# Patient Record
Sex: Female | Born: 1935 | Race: Black or African American | Hispanic: No | Marital: Married | Smoking: Former smoker
Health system: Southern US, Community
[De-identification: ages and names within clinical notes are randomized; demographics above are authoritative.]

## PROBLEM LIST (undated history)

## (undated) DIAGNOSIS — F419 Anxiety disorder, unspecified: Secondary | ICD-10-CM

## (undated) DIAGNOSIS — H269 Unspecified cataract: Secondary | ICD-10-CM

## (undated) DIAGNOSIS — M1990 Unspecified osteoarthritis, unspecified site: Secondary | ICD-10-CM

## (undated) DIAGNOSIS — D649 Anemia, unspecified: Secondary | ICD-10-CM

## (undated) DIAGNOSIS — I2510 Atherosclerotic heart disease of native coronary artery without angina pectoris: Secondary | ICD-10-CM

## (undated) DIAGNOSIS — C801 Malignant (primary) neoplasm, unspecified: Secondary | ICD-10-CM

## (undated) DIAGNOSIS — E039 Hypothyroidism, unspecified: Secondary | ICD-10-CM

## (undated) DIAGNOSIS — I10 Essential (primary) hypertension: Secondary | ICD-10-CM

## (undated) DIAGNOSIS — F0390 Unspecified dementia without behavioral disturbance: Secondary | ICD-10-CM

## (undated) DIAGNOSIS — E119 Type 2 diabetes mellitus without complications: Secondary | ICD-10-CM

## (undated) DIAGNOSIS — E7800 Pure hypercholesterolemia, unspecified: Secondary | ICD-10-CM

## (undated) DIAGNOSIS — Z789 Other specified health status: Secondary | ICD-10-CM

---

## 1998-09-07 ENCOUNTER — Other Ambulatory Visit: Admission: RE | Admit: 1998-09-07 | Discharge: 1998-09-07 | Payer: Self-pay | Admitting: Radiology

## 2003-04-20 ENCOUNTER — Encounter: Payer: Self-pay | Admitting: Emergency Medicine

## 2003-04-20 ENCOUNTER — Emergency Department (HOSPITAL_COMMUNITY): Admission: EM | Admit: 2003-04-20 | Discharge: 2003-04-20 | Payer: Self-pay | Admitting: Emergency Medicine

## 2003-10-19 ENCOUNTER — Other Ambulatory Visit: Admission: RE | Admit: 2003-10-19 | Discharge: 2003-10-19 | Payer: Self-pay | Admitting: Obstetrics and Gynecology

## 2004-01-12 ENCOUNTER — Ambulatory Visit (HOSPITAL_COMMUNITY): Admission: RE | Admit: 2004-01-12 | Discharge: 2004-01-12 | Payer: Self-pay | Admitting: General Surgery

## 2004-12-16 HISTORY — PX: MASTECTOMY: SHX3

## 2005-01-17 ENCOUNTER — Ambulatory Visit (HOSPITAL_COMMUNITY): Admission: RE | Admit: 2005-01-17 | Discharge: 2005-01-17 | Payer: Self-pay | Admitting: General Surgery

## 2005-01-21 ENCOUNTER — Ambulatory Visit (HOSPITAL_COMMUNITY): Admission: RE | Admit: 2005-01-21 | Discharge: 2005-01-21 | Payer: Self-pay | Admitting: General Surgery

## 2005-01-24 ENCOUNTER — Encounter (HOSPITAL_COMMUNITY): Admission: RE | Admit: 2005-01-24 | Discharge: 2005-01-25 | Payer: Self-pay | Admitting: Internal Medicine

## 2005-02-07 ENCOUNTER — Inpatient Hospital Stay (HOSPITAL_COMMUNITY): Admission: RE | Admit: 2005-02-07 | Discharge: 2005-02-17 | Payer: Self-pay | Admitting: General Surgery

## 2005-04-15 ENCOUNTER — Inpatient Hospital Stay (HOSPITAL_COMMUNITY): Admission: EM | Admit: 2005-04-15 | Discharge: 2005-04-21 | Payer: Self-pay | Admitting: Emergency Medicine

## 2006-02-26 ENCOUNTER — Ambulatory Visit (HOSPITAL_COMMUNITY): Admission: RE | Admit: 2006-02-26 | Discharge: 2006-02-26 | Payer: Self-pay | Admitting: Family Medicine

## 2006-03-05 ENCOUNTER — Ambulatory Visit (HOSPITAL_COMMUNITY): Admission: RE | Admit: 2006-03-05 | Discharge: 2006-03-05 | Payer: Self-pay | Admitting: Family Medicine

## 2006-03-05 ENCOUNTER — Encounter (INDEPENDENT_AMBULATORY_CARE_PROVIDER_SITE_OTHER): Payer: Self-pay | Admitting: Specialist

## 2006-03-05 ENCOUNTER — Encounter (INDEPENDENT_AMBULATORY_CARE_PROVIDER_SITE_OTHER): Payer: Self-pay | Admitting: *Deleted

## 2006-03-05 ENCOUNTER — Encounter (INDEPENDENT_AMBULATORY_CARE_PROVIDER_SITE_OTHER): Payer: Self-pay | Admitting: Diagnostic Radiology

## 2006-03-21 ENCOUNTER — Inpatient Hospital Stay (HOSPITAL_COMMUNITY): Admission: RE | Admit: 2006-03-21 | Discharge: 2006-03-24 | Payer: Self-pay | Admitting: General Surgery

## 2006-03-21 ENCOUNTER — Encounter (INDEPENDENT_AMBULATORY_CARE_PROVIDER_SITE_OTHER): Payer: Self-pay | Admitting: Specialist

## 2006-05-12 ENCOUNTER — Ambulatory Visit (HOSPITAL_COMMUNITY): Payer: Self-pay | Admitting: Oncology

## 2006-05-12 ENCOUNTER — Encounter (HOSPITAL_COMMUNITY): Admission: RE | Admit: 2006-05-12 | Discharge: 2006-06-11 | Payer: Self-pay | Admitting: Oncology

## 2006-08-29 ENCOUNTER — Encounter (HOSPITAL_COMMUNITY): Admission: RE | Admit: 2006-08-29 | Discharge: 2006-09-12 | Payer: Self-pay | Admitting: Oncology

## 2006-08-29 ENCOUNTER — Encounter: Admission: RE | Admit: 2006-08-29 | Discharge: 2006-09-12 | Payer: Self-pay | Admitting: Oncology

## 2006-08-29 ENCOUNTER — Ambulatory Visit (HOSPITAL_COMMUNITY): Payer: Self-pay | Admitting: Oncology

## 2007-02-27 ENCOUNTER — Ambulatory Visit (HOSPITAL_COMMUNITY): Payer: Self-pay | Admitting: Oncology

## 2007-02-27 ENCOUNTER — Encounter (HOSPITAL_COMMUNITY): Admission: RE | Admit: 2007-02-27 | Discharge: 2007-03-29 | Payer: Self-pay | Admitting: Oncology

## 2007-08-19 ENCOUNTER — Ambulatory Visit (HOSPITAL_COMMUNITY): Payer: Self-pay | Admitting: Oncology

## 2009-01-27 ENCOUNTER — Inpatient Hospital Stay (HOSPITAL_COMMUNITY): Admission: EM | Admit: 2009-01-27 | Discharge: 2009-02-03 | Payer: Self-pay | Admitting: Emergency Medicine

## 2009-01-31 ENCOUNTER — Encounter: Payer: Self-pay | Admitting: Neurology

## 2009-10-09 ENCOUNTER — Ambulatory Visit (HOSPITAL_COMMUNITY): Admission: RE | Admit: 2009-10-09 | Discharge: 2009-10-09 | Payer: Self-pay | Admitting: Internal Medicine

## 2009-10-12 ENCOUNTER — Ambulatory Visit (HOSPITAL_COMMUNITY): Admission: RE | Admit: 2009-10-12 | Discharge: 2009-10-12 | Payer: Self-pay | Admitting: Internal Medicine

## 2009-10-19 ENCOUNTER — Ambulatory Visit (HOSPITAL_COMMUNITY): Admission: RE | Admit: 2009-10-19 | Discharge: 2009-10-19 | Payer: Self-pay | Admitting: Internal Medicine

## 2011-01-06 ENCOUNTER — Encounter: Payer: Self-pay | Admitting: Family Medicine

## 2011-04-02 LAB — DIFFERENTIAL
Basophils Relative: 1 % (ref 0–1)
Eosinophils Absolute: 0.1 10*3/uL (ref 0.0–0.7)
Eosinophils Relative: 1 % (ref 0–5)
Eosinophils Relative: 2 % (ref 0–5)
Lymphs Abs: 1.3 10*3/uL (ref 0.7–4.0)
Lymphs Abs: 1.2 10*3/uL (ref 0.7–4.0)
Monocytes Absolute: 0.4 10*3/uL (ref 0.1–1.0)
Neutro Abs: 3.2 10*3/uL (ref 1.7–7.7)
Neutrophils Relative %: 66 % (ref 43–77)

## 2011-04-02 LAB — CBC
HCT: 34.1 % — ABNORMAL LOW (ref 36.0–46.0)
HCT: 36.3 % (ref 36.0–46.0)
Hemoglobin: 11.4 g/dL — ABNORMAL LOW (ref 12.0–15.0)
MCHC: 33.7 g/dL (ref 30.0–36.0)
MCV: 83.1 fL (ref 78.0–100.0)
MCV: 82.9 fL (ref 78.0–100.0)
Platelets: 185 10*3/uL (ref 150–400)
Platelets: 200 10*3/uL (ref 150–400)
RDW: 15.9 % — ABNORMAL HIGH (ref 11.5–15.5)
RDW: 16.1 % — ABNORMAL HIGH (ref 11.5–15.5)

## 2011-04-02 LAB — BASIC METABOLIC PANEL
BUN: 14 mg/dL (ref 6–23)
BUN: 14 mg/dL (ref 6–23)
BUN: 22 mg/dL (ref 6–23)
CO2: 27 mEq/L (ref 19–32)
CO2: 27 mEq/L (ref 19–32)
Chloride: 105 mEq/L (ref 96–112)
Chloride: 104 mEq/L (ref 96–112)
Chloride: 107 mEq/L (ref 96–112)
Creatinine, Ser: 1 mg/dL (ref 0.4–1.2)
Creatinine, Ser: 1.08 mg/dL (ref 0.4–1.2)
Glucose, Bld: 130 mg/dL — ABNORMAL HIGH (ref 70–99)
Glucose, Bld: 124 mg/dL — ABNORMAL HIGH (ref 70–99)
Potassium: 3.6 mEq/L (ref 3.5–5.1)
Potassium: 3.4 mEq/L — ABNORMAL LOW (ref 3.5–5.1)

## 2011-04-02 LAB — GLUCOSE, CAPILLARY
Glucose-Capillary: 103 mg/dL — ABNORMAL HIGH (ref 70–99)
Glucose-Capillary: 104 mg/dL — ABNORMAL HIGH (ref 70–99)
Glucose-Capillary: 109 mg/dL — ABNORMAL HIGH (ref 70–99)
Glucose-Capillary: 112 mg/dL — ABNORMAL HIGH (ref 70–99)
Glucose-Capillary: 135 mg/dL — ABNORMAL HIGH (ref 70–99)
Glucose-Capillary: 171 mg/dL — ABNORMAL HIGH (ref 70–99)
Glucose-Capillary: 176 mg/dL — ABNORMAL HIGH (ref 70–99)
Glucose-Capillary: 97 mg/dL (ref 70–99)
Glucose-Capillary: 105 mg/dL — ABNORMAL HIGH (ref 70–99)
Glucose-Capillary: 126 mg/dL — ABNORMAL HIGH (ref 70–99)
Glucose-Capillary: 143 mg/dL — ABNORMAL HIGH (ref 70–99)

## 2011-04-02 LAB — POCT I-STAT, CHEM 8
Chloride: 103 mEq/L (ref 96–112)
HCT: 36 % (ref 36.0–46.0)
Potassium: 3.8 mEq/L (ref 3.5–5.1)
Sodium: 141 mEq/L (ref 135–145)

## 2011-04-02 LAB — PROTIME-INR
INR: 1.1 (ref 0.00–1.49)
Prothrombin Time: 14 seconds (ref 11.6–15.2)

## 2011-04-30 NOTE — Consult Note (Signed)
NAME:  April Myers, April Myers                ACCOUNT NO.:  [id number]   MEDICAL RECORD NO.:  [id number]          PATIENT TYPE:  INP   LOCATION:  A320                          FACILITY:  [facility]   PHYSICIAN:  Kofi A. Gerilyn Pilgrim, M.D. DATE OF BIRTH:  04/09/1921   DATE OF CONSULTATION:  DATE OF DISCHARGE:                                 CONSULTATION   REASON FOR CONSULTATION:  Dizziness.   The patient is an 77 year old black female who developed the acute onset  of dizziness described as the room moving around.  She has had previous  bouts of dizzy spells, but reports that this one was distinctly  different and particularly more intense.  The spells being quite severe  lasted for about 30 minutes or so.  She did have some nausea and  vomiting.  No abdominal discomfort was reported.  No chest pain.  No  clear diplopia, although she reports some blurred vision.  No slurred  speech.   PAST MEDICAL HISTORY:  Significant for breast cancer.   SURGICAL HISTORY:  Hysterectomy.   ALLERGIES:  None listed.   SOCIAL HISTORY:  The patient lives alone although she is apparently had  support of a nephew who is present with her during this event.  No  alcohol use.  No illicit drug use.  No tobacco use.   PRESENT MEDICATION:  None luted.   PHYSICAL EXAMINATION:  Shows an obese pleasant lady in no acute  distress.  HEENT EVALUATION:  Neck is supple.  Head is normocephalic, atraumatic.  ABDOMEN:  Obese, but soft.  EXTREMITIES:  No significant edema.  MENTATION:  She is awake.  She is alert.  She is lucid, coherent.  No  dysarthria or aphasia is noted.  She follow commands briskly.  CN evaluation she has left beating nystagmus with primary gaze, is also  present towards the left.  She does have full extraocular movements,  although there is some limited upgaze,  possibly related to age.  Visual  fields are intact.  Pupils are 4 mm and briskly reactive.  Facial muscle  strength is symmetric.  Tongue is midline.   Motor examination shows good  strength involving the upper extremities.  She does have at least  antigravity strength and really about 4 involving the legs.  Reflexes  are significantly diminished in the legs.  They are normal in the upper  extremities.  Sensation normal to light touch and temperature.  Coordination shows a little bit of past pointing involving the left  upper extremity and possibly a mild amount of dysmetria.  She also has  good heel-to-shin on both sides.   Head CT scan is reviewed and shows chronic findings, nothing acute is  observed.  Sodium 141, potassium is 3.8, chloride 103, BUN 16,  creatinine 1.0, glucose 149, ionized calcium 1.00 (slightly low).  Hemoglobin 12.   ASSESSMENT:  Light acute vertigo with focal nystagmus and says evidence  suggestive of the dysmetria on the left side, all pointing to the  possibility of posterior circulation infarct, likely small.  Risk  factors include age.   RECOMMENDATIONS:  I would go ahead and start the patient on antiplatelet  agents. Obtain an MRI and MRA of brain, particularly looking at the  posterior circulation to evaluate for possible vertebrobasilar occlusive  disease/insufficiency.   Thanks this consultation.      Kofi A. Gerilyn Pilgrim, M.D.  Electronically Signed     KAD/MEDQ  D:  01/28/2009  T:  01/28/2009  Job:  [id number]

## 2011-04-30 NOTE — Group Therapy Note (Signed)
NAME:  April Myers, April Myers                ACCOUNT NO.:  [id number]   MEDICAL RECORD NO.:  [id number]          PATIENT TYPE:  INP   LOCATION:  A320                          FACILITY:  [facility]   PHYSICIAN:  Kofi A. Gerilyn Pilgrim, M.D. DATE OF BIRTH:  [birth year]   DATE OF PROCEDURE:  DATE OF DISCHARGE:                                 PROGRESS NOTE   SUBJECTIVE:  The patient does not report having any dizzy spells today.   PHYSICAL EXAMINATION:  VITAL SIGNS:  Temperature 97.7, pulse 61,  respirations 20, blood pressure 140/81.  GENERAL:  The patient is awake and alert.  She is being fed her meal and  is actually is able to feed herself.  NEUROLOGICAL:  She is awake, alert and oriented, converses well.  Speech  and language are normal.  Pupils are 4 mm and reactive.  She does have  full extraocular movements.  No nystagmus noted at this time which was  an improvement.  She has good strength throughout.  MRI shows no acute  infarct on Difusion imaging.  The results were reviewed in person.  She  also had an MRA which shows no flow in the basilar artery, worrisome for  high grade basilar stenosis that appears to be flowing into the left  vertebral, but no flow in the right vertebral.   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  Findings were discussed at length with the patient.  She  appears to have some difficulty comprehending the full measure and  wanted me to contact her sister, Katheren Shams, which I did contact  at [phone].  The sister was able to comprehend immediately the  significance of the problem and consented for both the angiogram and  possible angioplasty stenting if needed.  For now, will continue with  the current antiplatelet agent.  Patient clearly needs to have an  angiogram and possibly stenting.      Kofi A. Gerilyn Pilgrim, M.D.  Electronically Signed     KAD/MEDQ  D:  01/30/2009  T:  01/30/2009  Job:  [id number]

## 2011-04-30 NOTE — H&P (Signed)
NAME:  April Myers, April Myers                ACCOUNT NO.:  [id number]   MEDICAL RECORD NO.:  [id number]          PATIENT TYPE:  INP   LOCATION:  A320                          FACILITY:  [facility]   PHYSICIAN:  Tesfaye D. Felecia Shelling, MD   DATE OF BIRTH:  June 17, 1921   DATE OF ADMISSION:  01/27/2009  DATE OF DISCHARGE:  LH                              HISTORY & PHYSICAL   CHIEF COMPLAINT:  Dizziness.   HISTORY OF PRESENT ILLNESS:  This is an 77 year old female patient with  history of multiple medical illnesses, who was brought to emergency room  due to feeling dizzy and unable to get out of bed.  The patient  complains that everything is moving around her and she is having  difficult to get out of bed.  She is unstable and unable to walk.  She  has a problem with her gait.  The patient was evaluated in the emergency  room and MRI of the head was done, which showed only a chronic left  parietal lobe infarct.  There was no acute lesion.  Her labs were within  the normal limits.  However, the patient was unable to be ambulated and  send her back home.  She lives alone by herself.  The patient was then  admitted for further treatment and supportive care.   REVIEW OF SYSTEMS:  The patient feels nauseated.  She has known  vomiting, abdominal pain, chest pain, headache, shortness of breath,  dysuria, urgency, or frequency of urination.   PAST MEDICAL HISTORY:  1. History of CA of the right breast.  2. Diabetes mellitus, diet controlled.  3. Osteoarthritis.  4. Anemia.  5. Hypertension.  6. Hypothyroidism.   CURRENT MEDICATIONS:  1. Synthroid 125 mcg daily.  2. Amlodipine 5 mg daily.  3. Allegra 180 mg daily.  4. Ambien 10 mg at bedtime.  5. Lovastatin 20 mg daily.   SOCIAL HISTORY:  The patient lives alone.  No history of alcohol,  tobacco, or substance abuse.   PHYSICAL EXAMINATION:  GENERAL:  The patient is alert, awake, and  acutely sick looking.  VITAL SIGNS:  Blood pressure 149/67, pulse 73,  respiratory rate 20, and  temperature 98.7 degrees Fahrenheit.  CHEST:  Decreased air entry, bilateral rhonchi.  CARDIOVASCULAR:  First and second heart sound heard.  No murmur.  No  gallop.  ABDOMEN:  Soft and lax.  Bowel sound is positive.  No mass or  organomegaly.  EXTREMITIES:  No leg edema.   ASSESSMENT:  1. Severe vertigo.  2. Hypothyroidism.  3. Hypertension.  4. Diabetes mellitus.  5. History of right breast carcinoma.  6. Anemia.   PLAN:  We ill admit the patient and continue on meclizine.  We will do  Neurology consult.  We will do a fall precaution, and continue her on  regular medications.      Tesfaye D. Felecia Shelling, MD  Electronically Signed     TDF/MEDQ  D:  01/28/2009  T:  01/28/2009  Job:  [id number]

## 2011-04-30 NOTE — Group Therapy Note (Signed)
NAME:  April Myers, April Myers                ACCOUNT NO.:  [id number]   MEDICAL RECORD NO.:  [id number]          PATIENT TYPE:  INP   LOCATION:  A320                          FACILITY:  [facility]   PHYSICIAN:  Kofi A. Gerilyn Pilgrim, M.D. DATE OF BIRTH:  1921-09-05   DATE OF PROCEDURE:  DATE OF DISCHARGE:  02/03/2009                                 PROGRESS NOTE   The patient reports that she is doing fairly well.  She does not have  any dizzy spells today.  She did get up and walk around with assistance.   Temperature 98.8, pulse 66, respiration 20, and blood pressure 131/75.  The patient is awake, and alert.  She converses well.  She is lucid and  coherent.  She moves all 4 extremities.  The angiography was reviewed,  impression; she has an occluded proximal vertebral.  Radiologist believe  that there is some distal reconstitution via the left posterior  communicating artery.  Although, the blood vessels looks patent.   ASSESSMENT AND PLAN:  Occluded proximal basilar artery.  Unfortunately,  the patient presented with only vertiginous symptoms and ataxia.  I  believe, this is not a case for intervention as the blood vessels has  already occluded.  I would suggest that we add Plavix, aspirin and  continue this regimen for 3 months.  Thereafter, we may want to just use  single antiplatelet agent depending on how she does.  The case was  discussed with the patient and all the relatives.      Kofi A. Gerilyn Pilgrim, M.D.  Electronically Signed     KAD/MEDQ  D:  02/02/2009  T:  02/02/2009  Job:  [id number]

## 2011-05-03 NOTE — Discharge Summary (Signed)
April Myers, April Myers                ACCOUNT NO.:  [id number]   MEDICAL RECORD NO.:  [id number]          PATIENT TYPE:  INP   LOCATION:  A202                          FACILITY:  [facility]   PHYSICIAN:  Dirk Dress. Katrinka Blazing, M.D.   DATE OF BIRTH:  07-Aug-1921   DATE OF ADMISSION:  02/07/2005  DATE OF DISCHARGE:  03/05/2006LH                                 DISCHARGE SUMMARY   DISCHARGE DIAGNOSES:  1.  Villous tumor, right colon.  2.  Multiple polyps, right colon.  3.  Chronic anemia.  4.  Hypertension.  5.  Diabetes mellitus.  6.  Severe osteoarthritis.  7.  Hypokalemia.  8.  Postoperative anemia.   SPECIAL PROCEDURE:  1.  Colonoscopy with biopsy and polypectomy.  2.  Right colectomy.   DISPOSITION:  Patient discharged home in stable and satisfactory condition.  She will continue on her baseline medications. She will have home health  nurse followup. She will be seen in the office two weeks post discharge.   SUMMARY:  This is an 77 year old female who was scheduled for colonoscopy  with biopsy and resection of a large exophytic ulcerated lesion of the  cecum. The patient underwent colonoscopy. The patient underwent colonoscopy  on January 17, 2005, and was found to have three small polyps in the  ascending colon and a large polypoid mass in the cecum. She has an extremely  poor prep and it was elected not to do resection because of the poor prep.  The patient has undergone a two day bowel prep and was scheduled for repeat  colonoscopy. Other problems include anemia, diabetes mellitus,  osteoarthritis, hypertension, and hypothyroidism. The patient was found to  have a large villous tumor of the right colon, multiple polyps of the right  colon. It was elected to proceed with right colectomy and the patient was  admitted. It was felt that right colectomy would remove the most worrisome  lesion in the cecum and the other polypoid lesions of the ascending colon.  There was some concern because  she had severe limitation of motion of her  neck, and it was felt that she might need arthroscopic assisted intubation.  The patient underwent right colectomy uneventfully.  Side-to-side  anastomosis was performed. She tolerated the procedure well. In the  postoperative period, she had postoperative anemia that required  transfusion. Her blood sugars were controlled with sliding scale insulin  coverage. The patient was extremely immobile because of her severe arthritis  and it was very difficult to get her moving. During this hospitalization, on  February 28th, the patient had a medication error and received aspirin,  Sinemet, Protonix, Protonix, 65 units of Humulin 70/30, Neurontin, and  magnesium oxide. There were not her medications. She had no adverse reaction  to these medications. By February 13, 2005, the patient was having bowel  movements.  Because of hypoventilation she had CT angio which showed no  evidence of pulmonary embolus. She had no further problems. By the 5th, it  was felt that she stable enough for  discharge. She was afebrile. O2 saturation was 94% on  room air. White count  9600, hemoglobin 11.3.  She was discharged home on a baseline medication  with home health and physical therapy followup. She was given increased  potassium orally.      LCS/MEDQ  D:  03/30/2005  T:  03/30/2005  Job:  [id number]

## 2011-05-03 NOTE — H&P (Signed)
NAMEMACKIE, GOON NO.:  [id number]   MEDICAL RECORD NO.:  [id number]          PATIENT TYPE:  INP   LOCATION:  IC06                          FACILITY:  [facility]   PHYSICIAN:  Vania Rea, M.D. DATE OF BIRTH:  1921/03/03   DATE OF ADMISSION:  04/15/2005  DATE OF DISCHARGE:  LH                                HISTORY & PHYSICAL   PRIMARY CARE PHYSICIAN:  Annia Friendly. Hill, MD/Leroy C. Katrinka Blazing, M.D.   CHIEF COMPLAINT:  Confusion and falling.   HISTORY OF PRESENT ILLNESS:  This is an 77 year old African-American lady  with a history of diabetes, diet controlled, hypertension, hypothyroidism,  osteoarthritis, who underwent right hemicolectomy for possible malignancy, 2  months ago.  Her medications included Toprol, ACEi, ARB, potassium and  Lasix.  On her admission for right hemicolectomy, about 2 months ago did  have a CT scan of the chest to rule out PE.  She was apparently in her  baseline state of health after recuperating from her surgery; but for the  past week, according to her nephew, has been acting strange. Then, she fell  in the bathroom this morning and EMS was called. EMS reportedly found her  confused with a blood pressure of 78/42. She was placed on cardiac monitor  and thought to be in atrial fibrillation/ flutter.  She was saturating at  86% and went up to 99% on 100% nonrebreather.  She received a fluid bolus of  300 mL.  Her blood pressure went to 122/46; she was brought to the emergency  room.   The patient, at all times, denied pain.  In the emergency room the patient  was seen and evaluated and found to have a potassium of 9.4 with altered  mental status.  Hospitalist service was called to assist with management.   PAST MEDICAL HISTORY:  1.  Diabetes mellitus type 2.  2.  Hypertension.  3.  Status post right hemicolectomy for malignant versus nonmalignant mass.  4.  History of chronic anemia.   MEDICATIONS:  1.  Potassium chloride 20 mEq  twice daily.  2.  Lasix 40 mg twice daily.  3.  Enalapril 10 mg daily.  4.  Teveten 600 mg daily  5.  Meclizine 25 mg 4 times daily.  6.  Synthroid 200 mcg daily.  7.  Ambien 12.5 mg at bedtime.  8.  Ativan 1mg  qhs.   ALLERGIES:  No known drug allergies.   SOCIAL HISTORY:  Lives with her husband.  Family looks in on her.  Unable to  obtain other family history at this time.   FAMILY HISTORY:  Unable to obtain further due to patient's mental status.   REVIEW OF SYSTEMS:  Unable to obtain further due to patient's mental status.   PHYSICAL EXAMINATION:  GENERAL:  A confused, obese, elderly African-American  lady sitting up on the stretcher.  VITAL SIGNS:  Temperature is 98.5 rectally.  Her blood pressure initially  82/43, now 93/55.  Her pulse varies between 119 and 192.  She is having no  pain.  She is saturating at 95%  on 2 liters.  HEENT:  Her pupils are round.  NECK:  There is no obvious jugular venous distention.  No thyromegaly.  CHEST:  Clear to auscultation bilaterally.  CARDIOVASCULAR SYSTEM:  Irregularly, irregular, tachycardiac rhythm.  ABDOMEN:  Her abdomen is obese and nontender.  EXTREMITIES:  Without edema.   LABS:  Sodium 141, potassium 9.4, chloride 113, CO2 15, glucose 155, BUN  108, creatinine 4.3, calcium 10.0, total protein 7.5. Albumin 3.5.  AST 49,  ALT 16, alkaline phosphatase 16, total bilirubin 0.6.  Her initial EKG showed sinus rhythm with occasional atrial ectopic beats  with a rate of 99.  Repeat EKG now is reported as atrial fibrillation at a rate of 125; however,  it looks like a sinus rhythm with ectopic beats.  She does have repeat T  waves and poor R wave progression.  A CT of the head shows no acute  abnormalities.  Her white count is 13.1, hemoglobin 10.4, hematocrit 30.8,  platelets 359.  She has 89% neutrophils.  She has had a lumbar puncture  which is reported as clear colorless fluid.  ABG on 2 liters pH 7.24, pCO2  30, pO2 140, saturating  at 98.7%.   ASSESSMENT:  1.  Altered mental status due to acute renal failure.  2.  Severe hyperkalemia  3.  Leukocytosis and metabolic acidosis.  4.  Atrial arrhythmia with low blood pressure.  5.  History of hypertension.  6.  History of hyperthyroidism.  7.  Considering that this lady has already put out 700 mL of urine, it is      likely that this lady is in acute renal failure from a combination of      medications and inadequate fluid intake.  Possibly it may be related to      contrast administration, although this was some 2 months ago.  8.  Her atrial arrhythmia is possibly related to toxic levels of Synthroid      in the setting of acute renal failure.   PLAN:  I have discussed this patient with Dr. Kristian Covey of nephrology.  The  patient has already received bicarbonate and calcium chloride.  We will go  ahead and give insulin and glucose and continue sodium bicarb infusion.  We  will repeat serum potassium and get serum potassium estimations until serum  potassium falls below 6.5 and then change frequency. We will hydrate  vigorously with normal saline and simultaneously give Lasix.  We will admit  the patient to the intensive care unit.  Other plans as per orders.    LC/MEDQ  D:  04/15/2005  T:  04/15/2005  Job:  [id number]

## 2011-05-03 NOTE — Discharge Summary (Signed)
April Myers, April Myers                ACCOUNT NO.:  [id number]   MEDICAL RECORD NO.:  [id number]          PATIENT TYPE:  INP   LOCATION:  A316                          FACILITY:  [facility]   PHYSICIAN:  Dirk Dress. Katrinka Blazing, M.D.   DATE OF BIRTH:  [birth year]   DATE OF ADMISSION:  03/21/2006  DATE OF DISCHARGE:  04/09/2007LH                                 DISCHARGE SUMMARY   DISCHARGE DIAGNOSES:  1.  Right breast carcinoma.  2.  Diabetes mellitus.  3.  Osteoarthritis.  4.  Anemia.  5.  Hypertension.  6.  Severe osteoarthritis.  7.  Hypothyroidism.   SPECIAL PROCEDURE:  Modified radical mastectomy on April 6.   DISPOSITION:  The patient discharged home in stable satisfactory condition.   DISCHARGE MEDICATIONS:  1.  Tylox one every 4 hours as needed for pain.  2.  Teveten 600 mg daily.  3.  Sular 10 mg daily.  4.  Mobic 7.5 mg daily.  5.  Ambien 12.5 mg at bedtime.  6.  Synthroid 200 mcg daily.   The patient will be followed up in the office in 2 weeks.  She will have  change of dressings every other day by home health, and they will also  manage her Jackson-Pratt drains.   HISTORY:  An 77 year old female with history of bilateral breast masses on  routine mammography.  She had 2 masses of the right breast, one which showed  invasive mammary carcinoma, and other showed high-grade atypical ductal  hyperplasia.  The patient also had bloody nipple discharge from 3 of the  ducts in the right breast.  The right breast had a cystic lesion which had  negative cytology.  She was given considerable time and to decide about  surgery and, because she had multicentric disease in the right breast, it  was felt that she was a candidate for total mastectomy with node dissection.  Past history is given in the admission note.   The patient was admitted through day surgery on April 6 and underwent right  modified radical mastectomy without difficulty.  She had an uneventful  postoperative course. The  wound did well. Jackson-Pratt drains did not drain  very  and became mostly serosanguineous, and on the first postoperative day, her  hemoglobin remained stable.  Diabetes was fairly well controlled.  She was  discharged on the morning of the third postoperative day in satisfactory  condition.      Dirk Dress. Katrinka Blazing, M.D.  Electronically Signed     LCS/MEDQ  D:  06/15/2006  T:  06/15/2006  Job:  [id number]

## 2011-05-03 NOTE — Op Note (Signed)
NAMEDANEYA, HARTGROVE                ACCOUNT NO.:  [id number]   MEDICAL RECORD NO.:  [id number]          PATIENT TYPE:  INP   LOCATION:  A316                          FACILITY:  [facility]   PHYSICIAN:  Dirk Dress. Katrinka Blazing, M.D.   DATE OF BIRTH:  [birth year]   DATE OF PROCEDURE:  03/21/2006  DATE OF DISCHARGE:  03/24/2006                                 OPERATIVE REPORT   PREOPERATIVE DIAGNOSIS:  Right breast mass.   POSTOPERATIVE DIAGNOSIS:  Right breast cancer.   PROCEDURE:  Right modified radical mastectomy.   SURGEON:  Dirk Dress. Katrinka Blazing, M.D.   DESCRIPTION:  Under general anesthesia, the right breast was prepped and  draped in a sterile field.  The patient had two masses of the breast with a  bloody nipple discharge.  It was elected to do a modified radical or a total  mastectomy with node dissection.  An elliptical incision was made,  encompassing the major portion of the breast and the nipple along the  central portion and extending along the lateral aspect of the axilla.  A  superior flap was developed with electrocautery.  The inferior flap was  developed with electrocautery.  Dissection was carried down to the chest  wall medially.  The breast was then separated from the pectoralis muscle  with removal of the pectoralis fascia without difficulty.  It extended into  the axilla.  Dissection in the axilla was carried out.  The axillary fascia  was incised, all clava-areolar tissue up to the axillary vein.  The vein was  dissected.  Vessels were clipped with Hemoclips and divided.  Lymphatics  were clipped with hemoclips and divided.  The thoracodorsal neurovascular  bundle and the long thoracic nerve and __________  were preserved.  All  other tissue was removed.  The axillary contents were marked.  Hemostasis  was achieved.  J-P drains were placed.  One vein was placed over the chest  wall and another was placed in the deep axilla.  The breast flaps were then  reapproximated using 2-0  Monocryl and 3-0 Monocryl with the skin being  closed with staples.  Drains were secured with 3-0 nylon. The patient  tolerated the procedure well.  Dressings were placed.  She was awakened from  anesthesia uneventfully, transferred to a bed, and taken to the post  anesthesia care unit in satisfactory condition.      Dirk Dress. Katrinka Blazing, M.D.  Electronically Signed     LCS/MEDQ  D:  06/15/2006  T:  06/15/2006  Job:  [id number]

## 2011-05-03 NOTE — Discharge Summary (Signed)
NAME:  MAREA, REASNER                ACCOUNT NO.:  [id number]   MEDICAL RECORD NO.:  [id number]          PATIENT TYPE:  INP   LOCATION:  A320                          FACILITY:  [facility]   PHYSICIAN:  Tesfaye D. Felecia Shelling, MD   DATE OF BIRTH:  [birth year]   DATE OF ADMISSION:  01/27/2009  DATE OF DISCHARGE:  02/19/2010LH                               DISCHARGE SUMMARY   DISCHARGE DIAGNOSES:  1. Severe vertigo.  2. Cerebellar artery occlusion.  3. Diabetes mellitus.  4. Hypertension.  5. Orthostatic hypotension.  6. Anemia.  7. Osteoarthritis.  8. Hypothyroidism.  9. History of carcinoma of the breast.   DISCHARGE MEDICATIONS:  1. Synthroid 120 mcg p.o. daily.  2. Amlodipine 5 mg daily.  3. Allegra 180 mg daily.  4. Ambien 10 mg at bedtime as needed.  5. Lovastatin 20 mg daily.  6. Plavix 75 mg daily.  7. Aspirin 81 mg daily.   DISPOSITION:  The patient was discharged to home in a stable condition.   HOSPITAL COURSE:  This is an 77 year old female patient with a history  of multiple medical illnesses, brought to emergency room with compliant  of severe dizziness and vertigo.  The patient was unable to ambulate.  She was severely dizzy.  The patient was evaluated in emergency room and  she was admitted for further treatment.  Neurology consult was done.  The patient was started on symptomatic treatment.  She had MRI and MRA.  Her MRA showed a total occlusion of basilovertebral artery junction.  The patient also had a high-grade stenosis of left  internal carotid artery.  The patient was continued on symptomatic  treatment.  Neurology followup was done and was advised to start the  patient on Plavix and aspirin.  Her symptoms improved and the patient  was discharged to home to continue her current treatment.      Tesfaye D. Felecia Shelling, MD  Electronically Signed     TDF/MEDQ  D:  02/20/2009  T:  02/20/2009  Job:  [id number]

## 2011-05-03 NOTE — H&P (Signed)
NAME:  April Myers, April Myers                ACCOUNT NO.:  [id number]   MEDICAL RECORD NO.:  [id number]          PATIENT TYPE:  AMB   LOCATION:  DAY                           FACILITY:  [facility]   PHYSICIAN:  Jerolyn Shin C. Katrinka Blazing, M.D.   DATE OF BIRTH:  [birth year]   DATE OF ADMISSION:  DATE OF DISCHARGE:  LH                                HISTORY & PHYSICAL   77 year old female with a history of bilateral breast masses on routine  mammography.  She had two masses of the right breast, one of which showed  invasive mammary carcinoma and the other showed high grade atypical ductal  hyperplasia.  The patient has bloody nipple discharge from three ducts in  the right breast.  The left breast had a cystic lesion with negative  cytology.  The patient has been given time to consider surgery and, after  discussion, it is felt that her having multicystic disease with areas in the  right breast which suggest carcinoma that were not biopsied, she is a  candidate for total mastectomy with node dissection.   PAST MEDICAL HISTORY:  She has a history of villous tumor  of the right  colon status post right hemicolectomy in February 2006.  She had multiple  polyps of the right colon.  She has chronic anemia, hypertension, diabetes  mellitus, osteoarthritis, hypothyroidism.  She had a stress Myoview study in  February 2006 which was negative for ischemia.   MEDICATIONS:  Mobic 7.5 mg daily, Timentin 600 mg daily, Ambien 12.5 mg  q.h.s., Sular 10 mg daily, Synthroid 200 mcg daily.   PAST SURGICAL HISTORY:  Right hemicolectomy.   PHYSICAL EXAMINATION:  VITAL SIGNS:  Blood pressure 124/59, pulse 53, respirations 20, weight 205  pounds.  HEENT:  Unremarkable except for poor dentition.  NECK:  Extremely stiff with markedly decreased range of motion, no  tenderness, no masses, no bruit.  LUNGS:  Clear to auscultation.  HEART:  Regular rate and rhythm without murmurs, gallops, and rubs.  BREASTS:  No masses of the right  breast is palpable, no masses of the left  breast is palpable, she has bloody drainage from multiple ducts near her  right nipple.  Axilla unremarkable with no palpable nodes.  ABDOMEN:  Obese, soft, nontender, well healed surgical scar, no masses.  EXTREMITIES:  Decreased range of motion of the hips and knees with  moderately severe decreased range of motion of the right knee, no peripheral  edema.  NEUROLOGICAL:  No motor or sensory deficits.   IMPRESSION:  1.  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  2.  Chronic anemia.  3.  Hypertension.  4.  Diabetes mellitus.  5.  Osteoarthritis.  6.  Hypothyroidism.  7.  Villous tumor of the colon.   PLAN:  The patient will have total mastectomy on the right with right  axillary node dissection.      Dirk Dress. Katrinka Blazing, M.D.  Electronically Signed     LCS/MEDQ  D:  03/20/2006  T:  03/20/2006  Job:  [id number]

## 2011-05-03 NOTE — H&P (Signed)
April Myers, April Myers                ACCOUNT NO.:  [id number]   MEDICAL RECORD NO.:  [id number]          PATIENT TYPE:  AMB   LOCATION:  DAY                           FACILITY:  [facility]   PHYSICIAN:  Jerolyn Shin C. Katrinka Blazing, M.D.   DATE OF BIRTH:  05-28-1921   DATE OF ADMISSION:  DATE OF DISCHARGE:  LH                                HISTORY & PHYSICAL   An 77 year old female scheduled for a repeat colonoscopy for biopsy and  resection of a large exophytic ulcerated lesion at the cecum.  The patient  underwent colonoscopy on January 17, 2005.  She was found to have three  small polyps in the ascending colon and a large polypoid mass in the cecum.  She had an extremely poor prep and it was elected not to do any resection  because of the poor prep.  The patient has undergone a two-day bowel prep  and will have a repeat colonoscopy.  She also has extensive diverticulosis  which are pan colonic.   PAST HISTORY:  1.  The patient has anemia.  2.  Diabetes mellitus.  3.  Osteoarthritis.  4.  Hypertension.  5.  Hypothyroidism.   MEDICATIONS:  1.  Toprol XL 25 every day.  2.  Ambien 10 mg q.h.s.  3.  __________  600 mg every day.  4.  Enalapril 10 mg every day.  5.  Klor-Con 10 mEq every day.  6.  Lasix 40 mg every day.   PHYSICAL EXAMINATION:  VITAL SIGNS:  Blood pressure 150/90, pulse 80,  respirations 20, weight 226 pounds.  HEENT:  Unremarkable.  NECK:  Extremely stiff with markedly decreased range of motion.  No  tenderness.  No adenopathy or thyromegaly.  CHEST:  Clear to auscultation.  HEART:  Regular rate and rhythm without murmur, gallop or rub.  ABDOMEN:  Obese, soft, nontender.  No masses.  EXTREMITIES:  Decreased range of motion of hips and right knee with  increased crepitus of the right knee.  No peripheral edema.  NEUROLOGIC:  No focal motor, sensory, or cerebellar deficit.   IMPRESSION:  1.  Cecum mass, probably neoplastic.  2.  Ascending colon polyps.  3.  Chronic anemia.  4.  Hypertension.  5.  Diabetes mellitus.  6.  Osteoarthritis.   PLAN:  Repeat colonoscopy with biopsies and polypectomy.      LCS/MEDQ  D:  02/06/2005  T:  02/06/2005  Job:  [id number]

## 2011-05-03 NOTE — Consult Note (Signed)
**Note April via Obfuscation** [name], April Myers                ACCOUNT NO.:  [id number]   MEDICAL RECORD NO.:  [id number]          PATIENT TYPE:  INP   LOCATION:  IC06                          FACILITY:  [facility]   PHYSICIAN:  Jorja Loa, M.D.DATE OF BIRTH:  17-May-1921   DATE OF CONSULTATION:  04/15/2005  DATE OF DISCHARGE:                                   CONSULTATION   REASON FOR CONSULTATION:  Renal insufficiency and hypokalemia.   [name] is an 77 year old African-American with past medical history of  hypertension, diabetes, anemia, and history of villous tumor of right colon  status post surgery, presently came with history of altered mental status  and history of fall.  According to her husband, the patient was feeling sick  for a couple of days; however, this morning she fell down.  Since she was  sleepy, she does not know how she fell down, and the patient at this moment  is not able to state what happened.  At this moment, the patient complains  of mainly feeling thirsty, and she wants more water.  She denies any  shortness of breath, and also she wants not to get up from her bed. As there  is no history of fever, chills, sweating and no history of diarrhea.   PAST MEDICAL HISTORY:  As stated above,  1.  History of hypertension.  2.  History of chronic anemia.  3.  History of diabetes.  4.  History of severe osteoarthritis.  5.  History of hypokalemia.  6.  History of villous tumor of right colon.   PAST SURGICAL HISTORY:  1.  Status post right colectomy.  2.  Polypectomy.   MEDICATIONS AS AN OUTPATIENT:  The patient seems to be on ACE inhibitor,  diuretics, and also potassium.  However, at this moment I do not have  documentation for what she was taking.  Presently the patient is on IV  fluids at 30 mL per hour, and also she has received insulin 10 units and  also D50.   SOCIAL HISTORY:  No history of smoking, no history of alcohol abuse.   REVIEW OF SYSTEMS:  At this moment as  stated above, most of the information  is from her husband apart from the patient.  No fever.  History of altered  mental status, history of confusion, history of weakness.  At this moment,  her main complaint is feeling thirsty, and she wants to drink water.  No  shortness of breath, no dizziness, and no diarrhea.   PHYSICAL EXAMINATION:  VITAL SIGNS:  Blood pressure 65/40, heart rate 110.  GENERAL:  The patient seems to be awake but confused.  Answers questions but  at times she does not understand the question.  HEENT:  No conjunctival pallor or icterus.  Mucous membranes very dry.  NECK:  Supple.  No JVD.  CHEST:  Clear to auscultation.  HEART:  Regular rate and rhythm.  No murmur.  ABDOMEN:  Soft, positive bowel sounds.  EXTREMITIES:  No edema.   LABORATORY DATA AND OTHER STUDIES:  The patient has more than 700 mL  of  urine presently.  Also, she has some in the bag.   The pH is 7.243, PCO2 30.7, PO2 140.  White blood cell count 13.1,  hemoglobin 10.4, hematocrit 30.9, platelets 359.  Sodium 141, potassium 9.4,  chloride 113, CO2 15, BUN 108, creatinine 4.3.  Creatinine during her  previous admission in March 2006 was 1.1 and BUN of 9.  SGOT 40.  Liver  function tests are normal.  Calcium is 10.  Albumin is 3.5.  UA shows  specific gravity of 1.01, pH of 5.  She has small blood, and she has some  casts.  Leukocytes is negative.  Nitrite is negative.   ASSESSMENT:  1.  Renal insufficiency, at this moment seems to be acute, in a patient with      high BUN to creatinine ratio, hypertension.  At this moment previous      prerenal syndrome needs to be entertained as the most likely etiology.      However, at this moment, the patient also possibly can have acute      tubular necrosis.  2.  Hyperkalemia.  This is possibly a combination of potassium supplement,      dehydration, and also inhibition.  Her EKG showed atrial fibrillation.      Otherwise, no other consistent finding for  hyperkalemia, possibly this      could be because of the calcium given and also D50 and insulin.  3.  Metabolic acidosis.  Her CO2 is 15 with pH of 7.43, PCO2 of 30.7,      probably related to her renal insufficiency at this moment since patient      has an elevated white blood cell count, infection __________.  4.  Anemia, longstanding.  Hemoglobin and hematocrit are low.  She has      previous history of gastrointestinal bleeding.  Not sure what etiology      at this moment is.  5.  Leukocytosis.  6.  History of diabetes.  Her blood sugar is slightly high, and she is not      on any medication.  7.  History of hypotension.  This could be from severe dehydration versus      from sepsis.  8.  History of abdominal surgery.  9.  History of hypothyroidism.  10. History of colon surgery.   RECOMMENDATIONS:  Will give the patient normal saline as a bolus to support  her blood pressure since she is making urine.  Once her blood pressure comes  above 110, will continue with 150 mL of normal saline.  During that time,  will add sodium bicarbonate to her IV fluid.  At this moment as stated  above, she is on monitor, and except tachycardia, no other sign of acute  hyperkalemia.  However, since also patient seems to be making a significant  amount of urine, will follow her potassium.  Hopefully, with the Kayexalate,  good urine output, and the sodium bicarb which she is going to be getting,  that may control her potassium.  At this moment, dialysis seems to be a  little bit difficult because of patient's instability and also because she  is still making some urine and no EKG changes.      BB/MEDQ  D:  04/15/2005  T:  04/15/2005  Job:  [id number]

## 2011-05-03 NOTE — Procedures (Signed)
[name], April Myers NO.:  [id number]   MEDICAL RECORD NO.:  [id number]          PATIENT TYPE:  OUT   LOCATION:  RAD                           FACILITY:  [facility]   PHYSICIAN:  Nicki Guadalajara, M.D.     DATE OF BIRTH:  1921/07/30   DATE OF PROCEDURE:  01/21/2005  DATE OF DISCHARGE:                                  ECHOCARDIOGRAM   PROCEDURE:  2-D echo Doppler study.   INDICATIONS:  This study is performed in this 77 year old female with a  history of hypertension, diabetes mellitus to evaluate LV function.   FINDINGS:  1.  Technically this is an adequate M-mode, 2-dimensional, comprehensive      echo Doppler study.  2.  There is moderate concentric left ventricular hypertrophy with left      ventricular septal thickness measuring 1.5 cm and posterior wall      thickness measuring 1.3 cm.  Left ventricular end-diastolic and end-      systolic dimensions were normal at 4.4 and 2.6 cm, respectively.      Systolic function was normal.  There was evidence for mild delay in      diastolic relaxation.  3.  Left atrium is mildly increased at 4.3 cm.  The right atrium was upper      normal in size.  The right ventricle is normal.  4.  Aortic root dimension was normal at 2.9 cm.  5.  The aortic valve was mildly thickened and sclerotic.  Peak instantaneous      gradient is measured 17 mm, mean gradient 11 mm, calculated aortic valve      area 2.1 cm compatible with mild aortic sclerosis without significant      stenosis.  There was mild aortic regurgitation.  6.  There was mitral annular calcification anteriorly and posteriorly with      trace mitral regurgitation.  7.  Tricuspid valve was structurally normal.  8.  Pulmonic valve was not well visualized.  9.  There were no intramyocardial masses, thrombi, or fusions.   IMPRESSION:  Technically this was an adequate echo Doppler study  demonstrating normal left ventricular systolic function with a mild-to-  moderate left  ventricular hypertrophy and evidence for mild delay in  diastolic relaxation.  There is evidence for aortic valve sclerosis without  significant stenosis and mild aortic insufficiency.  There is mitral annular  calcification with trivial MR.      TK/MEDQ  D:  01/21/2005  T:  01/21/2005  Job:  [id number]   cc:   Dirk Dress. Katrinka Blazing, M.D.  [address]  [place]  [place] [address]  Fax: [phone]

## 2011-05-03 NOTE — Discharge Summary (Signed)
NAMESHARRA, CAYABYAB                ACCOUNT NO.:  [id number]   MEDICAL RECORD NO.:  [id number]          PATIENT TYPE:  INP   LOCATION:  A209                          FACILITY:  [facility]   PHYSICIAN:  Lonia Blood, M.D.      DATE OF BIRTH:  June 18, 1921   DATE OF ADMISSION:  04/15/2005  DATE OF DISCHARGE:  05/07/2006LH                                 DISCHARGE SUMMARY   PRIMARY CARE PHYSICIAN:  Dr. Loleta Chance.   DISCHARGE DIAGNOSES:  1.  Acute renal failure which has resolved.  2.  Hypokalemia.  3.  Normocytic anemia.  4.  Increased cardiac enzymes.  5.  Diabetes type 2, controlled off medications.  6.  Hypertension, controlled of medications.  7.  Hypothyroidism.   DISCHARGE MEDICATIONS:  1.  Ambien 5 mg at night.  2.  Colace 100 mg p.o. b.i.d.  3.  Synthroid 200 mcg daily.   DISPOSITION:  The patient is being discharged home with home health PT/OT  and initial RN visit.  The patient was offered the chance to go to skilled  nursing facility but she refused.  She had a discussion with her family also  and the patient was adamant that she would rather have home health than go  to a nursing facility.  The patient is of sound mind to be able to make a  decision.  As such, we have decided to go ahead and just give her what she  wants at this time.  So we will try home health PT/OT and RN for now.  However, the patient realizes that if for any reason this fails in the  future and she falls again or has a problem with care at home, she will need  to consider a skilled nursing facility.  At this time, her nephew and her  husband are willing to help with her care.   PROCEDURES PERFORMED:  1.  Chest x-ray performed on Apr 20, 2005 shows low inspiratory lung volumes      but no acute findings.  2.  Chest CT scan performed on Apr 21, 2005, shows basilar atelectasis and      small pleural effusions, no evidence of pulmonary embolism on limited      exam.  Mild aneurysmal dilatation of distal aortic  arch, 3.6 cm in      greatest diameter, origin of left common carotid artery, and right      brachiocephalic trunk, which was prominent in size, 2.3 x 2.4 cm.  3.  A CT scan without contrast also performed on Apr 15, 2005, shows no acute      intracranial abnormalities.  Moderate generalized atrophy.  4.  Lumbar puncture on Apr 18, 2005, with ultrasound performed on May 2, 006,      showed no hydronephrosis, normal sized kidneys bilaterally, small      bilateral corticorenal cysts were seen.  5.  Another chest x-ray on Apr 16, 2005, shows no pneumothorax after      attempted central line placement.  6.  Follow-up chest x-ray performed on Apr 16, 2005, also shows  no      pneumothorax.  7.  Another chest x-ray on Apr 17, 2005, shows cardiomegaly with mild      bibasilar atelectasis or infiltrates.  8.  Central line also placed on Apr 16, 2005, has since been discontinued.   CONSULTATIONS:  Jorja Loa, M.D., nephrology.   HISTORY:  Please refer to dictated history and physical by Dr. Orvan Falconer on  Apr 15, 2005.  However, this is an 77 year old African-American female that  was brought in with confusion and falls.  The patient had diabetes that was  diet controlled, also hypertension, hypothyroidism, and osteoarthritis.  She  had right hemicolectomy for Colonic malignancy about two months earlier.  The patient was taking a combination of diuretics, ACE inhibitors at home.  She is also on meclizine, Ambien, and Ativan as well.  She came in with a  history of having acted strange and she fell in the bathroom on the morning  of admission.  The EMS found her confused with a blood pressure of 178/42.  On the cardiac monitor, she was having some atrial tachycardia with atrial  fibrillation with flutter.  Her saturations were also down into the 80s.  The patient was given fluids resuscitated, her blood pressure stabilized.  She was transported to the emergency room.  Her potassium in the  emergency  room was 9.4.  Mental status was altered.  She was subsequently admitted  with acute renal failure.  At that time, her labs showed a BUN of 108,  creatinine of 4.3, sodium 142, and potassium 9.4.  Chloride 113, CO2 15,  total protein 7.5, with albumin of 3.5.  She also had an AST of 49, ALT 16  with a normal bilirubin.  Her EKG on admission showed atrial fibrillation at  a rate of 125, but she did have some P-waves, meaning that this is probably  just ectopic beats.  Further workup at the time included lumbar puncture as  above.  Also, CT scan of the head as indicated above.  She was found to be  anemic with a hemoglobin of 10.4 also.  Her gas on admission showed pH of  7.24, PCO2 of 30, PO2 of 140, and her saturations of 98%.  The patient was  subsequently admitted for management of acute renal failure.   HOSPITAL COURSE:  #1 -  ACUTE RENAL FAILURE:  The patient's acute renal  failure was thought to be a combination of multiple factors, mainly drugs.  She was on Lasix, ACE inhibitor.  And also noting that the patient may not  have been eating adequately.  She was also on potassium supplement 20 mEq  b.i.d.  All of this may have contributed to her hyperkalemia as well as her  acute renal failure.  She was taken off all those medications, placed  initially in the ICU, hydrated adequately, and since then the patient's  renal function has reverted to normal.  Her mentation improved remarkably,  and the patient is currently stable.  She has remained off all  antihypertensives and her blood pressure has remained great throughout  hospitalization.  At this point, therefore, we are not putting the patient  back on any of the medications, we will let her go home and follow up next  week with Dr. Loleta Chance, and he can restart her on these medications gradually if  needed.  I may advise that if needed maybe the patient should go on some calcium channel blocker.  At least that will reduce  the  risk of renal  failure in the future.   #2 -  HYPERTENSION:  As indicated above, the patient's blood pressure was  great in the hospital without any need for her high potassium's.  Subsequently, she will need to be followed up within the next one week after  she is back home on her home diet and environment.   #3 -  DIABETES:  The patient also has a history of diabetes at home.  She  has been diet controlled at her home, and in the hospital, the patient did  not require any insulin.  She was on sliding scale insulin, but has  consistently done well without any insulin.  Her hemoglobin A1C was 6.   #4 -  INCREASED CARDIAC ENZYMES:  The patient had some present increase in  her cardiac enzymes;  however, this seemed to be related to her renal  insufficiency.  Mainly, the CK was the one that was elevated initially, and  troponin has gone up also, but the MB fraction was low.  It was for the most  part thought to be related to her acute renal failure.  She had no chest  pain and no complaints of this.   #5 -  HYPOKALEMIA:  After the patient's renal function has improved, she  continued to have episodes of hypokalemia, but mild with potassium of 3.3.  It is not clear if the patient is having some adrenal issues but it could  just be total body depleted from her long-standing use of diuretic.  I will  therefore try to replete the patient's potassium and we have been doing that  in the hospital.  She will need to have BMET checked early this coming week  to see if her potassium has stabilized.  If not, she may need some workup  for adrenal insufficiency.  Also magnesium needs to be checked to see if  that is contributing.   DISCHARGE LABORATORY DATA:  Her labs on the day of discharge include white  count of 5.8, hemoglobin 12.5, platelets 160.  Sodium 142, potassium 3.3,  chloride 112, CO2 29, BUN 12, creatinine 1.1, and glucose 92, calcium 7.9.      LG/MEDQ  D:  04/21/2005  T:   04/21/2005  Job:  [id number]

## 2011-05-03 NOTE — Op Note (Signed)
[name], April Myers                ACCOUNT NO.:  [id number]   MEDICAL RECORD NO.:  [id number]          PATIENT TYPE:  INP   LOCATION:  A322                          FACILITY:  [facility]   PHYSICIAN:  Dirk Dress. Katrinka Blazing, M.D.   DATE OF BIRTH:  12-Feb-1921   DATE OF PROCEDURE:  02/08/2005  DATE OF DISCHARGE:                                 OPERATIVE REPORT   PREOPERATIVE DIAGNOSIS:  Villous tumor, right colon.   POSTOPERATIVE DIAGNOSIS:  Villous tumor, right colon.   PROCEDURE:  Right colectomy.   SURGEON:  Dr. Katrinka Blazing.   DESCRIPTION:  Under general anesthesia, the patient's abdomen was prepped  and draped in a sterile field. Midline incision was made. Exploration  revealed a large amount of intra-abdominal fat with very thickened omentum  and extremely thickened mesentery. The mass in the right colon could not be  felt because it was only about 2 cm. The mass in the cecum could not be felt  because the cecum was essentially encompassed in fat. The colon was not  opened since it was decided that a right colectomy was going to be done  anyway.   The Bookwalter retractor was placed, and the abdomen was packed off. Cecum  was mobilized along its peritoneal reflection up to the hepatic flexure. The  terminal ileum was mobilized. The colon was transected using a GIA60 stapler  at the hepatic flexure. The mesentery was then serially clamped and divided  with vessels being tied with double ligatures of 2-0 silk. This was  continued on to the terminal ileum. Terminal ileum was transected using a  GIA60 stapler. Specimen was passed off. A side-to-side anastomosis was then  performed. The antimesenteric border of the terminal ileum was stapled to  the antimesenteric border of the proximal transverse colon using a GIA60  stapler and a TA60 stapler. There was no tension on the staple line. Staple  line was not reinforced. The abdomen was irrigated. The mesenteric defect  was closed with running 2-0  Vicryl. Wound irrigation was carried out.  Estimated blood loss was about 150 cc. Exploration of upper abdomen was  unremarkable. Nasogastric tube was positioned in the stomach. Sponge counts  were verified x2. The abdomen was then closed using #1 Prolene on the  fascia, 2-0 Vicryl on the subcutaneous tissue, and staples on the skin.  Dressing and 4x4s and tape were placed. The patient tolerated the procedure  well. She was awakened from anesthesia uneventfully, transferred to a bed,  and taken to the post-anesthesia care unit for further monitoring.      LCS/MEDQ  D:  02/08/2005  T:  02/08/2005  Job:  [id number]

## 2013-11-26 ENCOUNTER — Other Ambulatory Visit (HOSPITAL_COMMUNITY): Payer: Self-pay | Admitting: Internal Medicine

## 2013-11-26 DIAGNOSIS — N6320 Unspecified lump in the left breast, unspecified quadrant: Secondary | ICD-10-CM

## 2013-12-15 ENCOUNTER — Other Ambulatory Visit (HOSPITAL_COMMUNITY): Payer: Self-pay | Admitting: Internal Medicine

## 2013-12-15 ENCOUNTER — Ambulatory Visit (HOSPITAL_COMMUNITY)
Admission: RE | Admit: 2013-12-15 | Discharge: 2013-12-15 | Disposition: A | Discharge status: Home / Self Care | Payer: Medicare Other | Source: Ambulatory Visit | Attending: Internal Medicine | Admitting: Internal Medicine

## 2013-12-15 DIAGNOSIS — N6320 Unspecified lump in the left breast, unspecified quadrant: Secondary | ICD-10-CM

## 2013-12-15 DIAGNOSIS — N63 Unspecified lump in unspecified breast: Secondary | ICD-10-CM | POA: Insufficient documentation

## 2014-01-11 ENCOUNTER — Other Ambulatory Visit (HOSPITAL_COMMUNITY): Payer: Self-pay | Admitting: General Surgery

## 2014-01-11 DIAGNOSIS — N6320 Unspecified lump in the left breast, unspecified quadrant: Secondary | ICD-10-CM

## 2014-01-12 ENCOUNTER — Other Ambulatory Visit (HOSPITAL_COMMUNITY): Payer: Self-pay | Admitting: General Surgery

## 2014-01-12 ENCOUNTER — Encounter (HOSPITAL_COMMUNITY): Payer: Self-pay

## 2014-01-12 ENCOUNTER — Ambulatory Visit (HOSPITAL_COMMUNITY)
Admission: RE | Admit: 2014-01-12 | Discharge: 2014-01-12 | Disposition: A | Discharge status: Home / Self Care | Payer: Medicare Other | Source: Ambulatory Visit | Attending: General Surgery | Admitting: General Surgery

## 2014-01-12 VITALS — BP 134/70 | HR 92 | Temp 97.9°F | Resp 20

## 2014-01-12 DIAGNOSIS — R920 Mammographic microcalcification found on diagnostic imaging of breast: Secondary | ICD-10-CM | POA: Insufficient documentation

## 2014-01-12 DIAGNOSIS — N6320 Unspecified lump in the left breast, unspecified quadrant: Secondary | ICD-10-CM

## 2014-01-12 DIAGNOSIS — N63 Unspecified lump in unspecified breast: Secondary | ICD-10-CM | POA: Insufficient documentation

## 2014-01-12 DIAGNOSIS — Z7901 Long term (current) use of anticoagulants: Secondary | ICD-10-CM | POA: Insufficient documentation

## 2014-01-12 HISTORY — DX: Type 2 diabetes mellitus without complications: E11.9

## 2014-01-12 HISTORY — DX: Malignant (primary) neoplasm, unspecified: C80.1

## 2014-01-12 HISTORY — DX: Anemia, unspecified: D64.9

## 2014-01-12 HISTORY — DX: Atherosclerotic heart disease of native coronary artery without angina pectoris: I25.10

## 2014-01-12 HISTORY — DX: Unspecified osteoarthritis, unspecified site: M19.90

## 2014-01-12 MED ORDER — LIDOCAINE HCL (PF) 2 % IJ SOLN
INTRAMUSCULAR | Status: AC
  Filled 2014-01-12: qty 10

## 2014-01-12 NOTE — Progress Notes (Signed)
Patient medications reviewed for blood thinners and none noted also caregivers asked if patient on blood thinners and stated no. Upon breast biopsy physician asked about increased bleeding and additional medication sheet found in chart stating patient on plavix.

## 2014-01-12 NOTE — Discharge Instructions (Signed)
Breast Biopsy °Care After °These instructions give you information on caring for yourself after your procedure. Your doctor may also give you more specific instructions. Call your doctor if you have any problems or questions after your procedure. °HOME CARE °· Only take medicine as told by your doctor. °· Do not take aspirin. °· Keep your sutures (stitches) dry when bathing. °· Protect the biopsy area. Do not let the area get bumped. °· Avoid activities that could pull the biopsy site open until your doctor approves. This includes: °· Stretching. °· Reaching. °· Exercise. °· Sports. °· Lifting more than 3lb. °· Continue your normal diet. °· Wear a good support bra for as long as told by your doctor. °· Change any bandages (dressings) as told by your doctor. °· Do not drink alcohol while taking pain medicine. °· Keep all doctor visits as told. Ask when your test results will be ready. Make sure you get your test results. °GET HELP RIGHT AWAY IF:  °· You have a fever. °· You have more bleeding (more than a small spot) from the biopsy site. °· You have trouble breathing. °· You have yellowish-white fluid (pus) coming from the biopsy site. °· You have redness, puffiness (swelling), or more pain in the biopsy site. °· You have a bad smell coming from the biopsy site. °· Your biopsy site opens after sutures, staples, or sticky strips have been removed. °· You have a rash. °· You need stronger medicine. °MAKE SURE YOU: °· Understand these instructions. °· Will watch your condition. °· Will get help right away if you are not doing well or get worse. °Document Released: 09/28/2009 Document Revised: 02/24/2012 Document Reviewed: 01/12/2012 °[employer]® Patient Information ©2014 [employer]. ° °Breast Biopsy °A breast biopsy is a test during which a sample of tissue is taken from your breast. The breast tissue is looked at under a microscope for cancer cells.  °BEFORE THE PROCEDURE °· Make plans to have someone drive you home  after the test. °· Do not smoke for 2 weeks before the test. Stop smoking, if you smoke. °· Do not drink alcohol for 24 hours before the test. °· Wear a good support bra to the test. °PROCEDURE  °You may be given one of the following: °· A medicine to numb the breast area (local anesthesia). °· A medicine to make you sleep (general anesthesia). °There are different types of breast biopsies. They include: °· Fine-needle aspiration. °· A needle is put into the breast lump. °· The needle takes out fluid and cells from the lump. °· Ultrasound imaging may be used to help find the lump and to put the needle it the right spot. °· Core-needle biopsy. °· A needle is put into the breast lump. °· The needle is put in your breast 3 6 times. °· The needle removes breast tissue. °· An ultrasound image or X-ray is often used to find the right spot to put in the needle. °· Stereotactic biopsy. °· X-rays and a computer are used to study X-ray pictures of the breast lump. °· The computer finds where the needle needs to be put into the breast. °· Tissue samples are taken out. °· Vacuum-assisted biopsy. °· A small cut (incision) is made in your breast. °· A biopsy device is put through the cut and into the breast tissue. °· The biopsy device draws abnormal breast tissue into the biopsy device. °· A large tissue sample is often removed. °· No stitches are needed. °· Ultrasound-guided core-needle   biopsy. °· Ultrasound imaging helps guide the needle into the area of the breast that is not normal. °· A cut is made in the breast. The needle is put in the needle. °· Tissue samples are taken out. °· Open biopsy. °· A large cut is made in the breast. °· Your doctor will try to remove the whole breast lump or as much as possible. °All tissue, fluid, or cell samples are looked at under a microscope.  °AFTER THE PROCEDURE °· You will be taken to an area to recover. You will be able to go home once you are doing well and are without  problems. °· You may have bruising on your breast. This is normal. °· A pressure bandage (dressing) may be put on your breast for 24 8 hours. This type of bandage is wrapped tightly around your chest. It helps stop fluid from building up underneath tissues. °Document Released: 02/24/2012 Document Reviewed: 02/24/2012 °ExitCare® Patient Information ©2014 [employer]. ° °

## 2014-01-13 ENCOUNTER — Ambulatory Visit (HOSPITAL_COMMUNITY)
Admission: RE | Admit: 2014-01-13 | Discharge: 2014-01-13 | Disposition: A | Discharge status: Home / Self Care | Payer: Medicare Other | Source: Ambulatory Visit | Attending: General Surgery | Admitting: General Surgery

## 2014-01-13 ENCOUNTER — Other Ambulatory Visit (HOSPITAL_COMMUNITY): Payer: Self-pay | Admitting: General Surgery

## 2014-01-13 DIAGNOSIS — N6320 Unspecified lump in the left breast, unspecified quadrant: Secondary | ICD-10-CM

## 2014-01-14 ENCOUNTER — Other Ambulatory Visit (HOSPITAL_COMMUNITY): Payer: Self-pay | Admitting: General Surgery

## 2014-01-14 DIAGNOSIS — N6320 Unspecified lump in the left breast, unspecified quadrant: Secondary | ICD-10-CM

## 2014-01-19 ENCOUNTER — Other Ambulatory Visit (HOSPITAL_COMMUNITY): Payer: Self-pay | Admitting: General Surgery

## 2014-01-19 ENCOUNTER — Ambulatory Visit (HOSPITAL_COMMUNITY)
Admission: RE | Admit: 2014-01-19 | Discharge: 2014-01-19 | Disposition: A | Discharge status: Home / Self Care | Payer: Medicare Other | Source: Ambulatory Visit | Attending: General Surgery | Admitting: General Surgery

## 2014-01-19 ENCOUNTER — Encounter (HOSPITAL_COMMUNITY): Payer: Self-pay

## 2014-01-19 DIAGNOSIS — N6320 Unspecified lump in the left breast, unspecified quadrant: Secondary | ICD-10-CM

## 2014-01-19 DIAGNOSIS — R229 Localized swelling, mass and lump, unspecified: Principal | ICD-10-CM

## 2014-01-19 DIAGNOSIS — IMO0002 Reserved for concepts with insufficient information to code with codable children: Secondary | ICD-10-CM

## 2014-01-19 DIAGNOSIS — N63 Unspecified lump in unspecified breast: Secondary | ICD-10-CM | POA: Insufficient documentation

## 2014-01-19 DIAGNOSIS — D0590 Unspecified type of carcinoma in situ of unspecified breast: Secondary | ICD-10-CM | POA: Insufficient documentation

## 2014-01-19 DIAGNOSIS — C50919 Malignant neoplasm of unspecified site of unspecified female breast: Secondary | ICD-10-CM | POA: Insufficient documentation

## 2014-01-19 MED ORDER — LIDOCAINE HCL (PF) 2 % IJ SOLN
INTRAMUSCULAR | Status: AC
  Filled 2014-01-19: qty 10

## 2014-01-19 MED ORDER — LIDOCAINE HCL (PF) 2 % IJ SOLN
10.0000 mL | Freq: Once | INTRAMUSCULAR | Status: AC
  Administered 2014-01-19: 10 mL

## 2014-01-19 MED ORDER — LIDOCAINE HCL (PF) 2 % IJ SOLN
INTRAMUSCULAR | Status: AC
  Administered 2014-01-19: 10 mL
  Filled 2014-01-19: qty 10

## 2014-01-19 NOTE — Progress Notes (Signed)
Biopsy complete no signs of distress  

## 2014-01-20 ENCOUNTER — Other Ambulatory Visit (HOSPITAL_COMMUNITY): Payer: Medicare Other | Admitting: General Surgery

## 2014-01-20 DIAGNOSIS — N6320 Unspecified lump in the left breast, unspecified quadrant: Secondary | ICD-10-CM

## 2014-02-08 DIAGNOSIS — C50912 Malignant neoplasm of unspecified site of left female breast: Secondary | ICD-10-CM | POA: Insufficient documentation

## 2014-02-09 ENCOUNTER — Encounter (HOSPITAL_COMMUNITY): Payer: Medicare Other | Attending: Hematology and Oncology

## 2014-02-09 ENCOUNTER — Encounter (HOSPITAL_COMMUNITY): Payer: Self-pay

## 2014-02-09 VITALS — BP 157/56 | HR 82 | Temp 98.3°F | Resp 16 | Ht 63.0 in | Wt 217.0 lb

## 2014-02-09 DIAGNOSIS — Z7902 Long term (current) use of antithrombotics/antiplatelets: Secondary | ICD-10-CM | POA: Insufficient documentation

## 2014-02-09 DIAGNOSIS — C50912 Malignant neoplasm of unspecified site of left female breast: Secondary | ICD-10-CM

## 2014-02-09 DIAGNOSIS — I2510 Atherosclerotic heart disease of native coronary artery without angina pectoris: Secondary | ICD-10-CM | POA: Insufficient documentation

## 2014-02-09 DIAGNOSIS — Z87891 Personal history of nicotine dependence: Secondary | ICD-10-CM | POA: Insufficient documentation

## 2014-02-09 DIAGNOSIS — C50119 Malignant neoplasm of central portion of unspecified female breast: Secondary | ICD-10-CM | POA: Insufficient documentation

## 2014-02-09 DIAGNOSIS — Z794 Long term (current) use of insulin: Secondary | ICD-10-CM | POA: Insufficient documentation

## 2014-02-09 DIAGNOSIS — G609 Hereditary and idiopathic neuropathy, unspecified: Secondary | ICD-10-CM

## 2014-02-09 DIAGNOSIS — Z853 Personal history of malignant neoplasm of breast: Secondary | ICD-10-CM | POA: Insufficient documentation

## 2014-02-09 DIAGNOSIS — Z171 Estrogen receptor negative status [ER-]: Secondary | ICD-10-CM

## 2014-02-09 DIAGNOSIS — E039 Hypothyroidism, unspecified: Secondary | ICD-10-CM | POA: Insufficient documentation

## 2014-02-09 DIAGNOSIS — C50419 Malignant neoplasm of upper-outer quadrant of unspecified female breast: Secondary | ICD-10-CM

## 2014-02-09 DIAGNOSIS — Z9010 Acquired absence of unspecified breast and nipple: Secondary | ICD-10-CM | POA: Insufficient documentation

## 2014-02-09 DIAGNOSIS — E119 Type 2 diabetes mellitus without complications: Secondary | ICD-10-CM | POA: Insufficient documentation

## 2014-02-09 DIAGNOSIS — I739 Peripheral vascular disease, unspecified: Secondary | ICD-10-CM | POA: Insufficient documentation

## 2014-02-09 LAB — CBC WITH DIFFERENTIAL/PLATELET
Basophils Absolute: 0 10*3/uL (ref 0.0–0.1)
Basophils Relative: 0 % (ref 0–1)
Eosinophils Absolute: 0.1 10*3/uL (ref 0.0–0.7)
Eosinophils Relative: 1 % (ref 0–5)
HEMATOCRIT: 32.8 % — AB (ref 36.0–46.0)
Hemoglobin: 10.7 g/dL — ABNORMAL LOW (ref 12.0–15.0)
LYMPHS PCT: 18 % (ref 12–46)
Lymphs Abs: 1.4 10*3/uL (ref 0.7–4.0)
MCH: 28.1 pg (ref 26.0–34.0)
MCHC: 32.6 g/dL (ref 30.0–36.0)
MCV: 86.1 fL (ref 78.0–100.0)
Monocytes Absolute: 0.6 10*3/uL (ref 0.1–1.0)
Monocytes Relative: 8 % (ref 3–12)
Neutro Abs: 5.7 10*3/uL (ref 1.7–7.7)
Neutrophils Relative %: 73 % (ref 43–77)
Platelets: 343 10*3/uL (ref 150–400)
RBC: 3.81 MIL/uL — ABNORMAL LOW (ref 3.87–5.11)
RDW: 14.5 % (ref 11.5–15.5)
WBC: 7.8 10*3/uL (ref 4.0–10.5)

## 2014-02-09 LAB — COMPREHENSIVE METABOLIC PANEL
ALT: 46 U/L — ABNORMAL HIGH (ref 0–35)
AST: 51 U/L — AB (ref 0–37)
Albumin: 3 g/dL — ABNORMAL LOW (ref 3.5–5.2)
Alkaline Phosphatase: 82 U/L (ref 39–117)
BUN: 15 mg/dL (ref 6–23)
CO2: 28 meq/L (ref 19–32)
CREATININE: 0.87 mg/dL (ref 0.50–1.10)
Calcium: 9 mg/dL (ref 8.4–10.5)
Chloride: 96 mEq/L (ref 96–112)
GFR, EST AFRICAN AMERICAN: 66 mL/min — AB (ref >90)
GFR, EST NON AFRICAN AMERICAN: 57 mL/min — AB (ref >90)
Glucose, Bld: 159 mg/dL — ABNORMAL HIGH (ref 70–99)
Potassium: 4.2 mEq/L (ref 3.7–5.3)
Sodium: 136 mEq/L — ABNORMAL LOW (ref 137–147)
Total Bilirubin: 0.2 mg/dL — ABNORMAL LOW (ref 0.3–1.2)
Total Protein: 8 g/dL (ref 6.0–8.3)

## 2014-02-09 NOTE — Patient Instructions (Signed)
[facility] Discharge Instructions  RECOMMENDATIONS MADE BY THE CONSULTANT AND ANY TEST RESULTS WILL BE SENT TO YOUR REFERRING PHYSICIAN.  EXAM FINDINGS BY THE PHYSICIAN TODAY AND SIGNS OR SYMPTOMS TO REPORT TO CLINIC OR PRIMARY PHYSICIAN: Exam and findings as discussed by Dr. Barnet Glasgow.  INSTRUCTIONS/FOLLOW-UP:  An appointment has been made for you at [facility] to have a PET scan in their radiology department.  This appointment is scheduled for February 18, 2014 at 11:00 AM.  Please arrive by 10:45 AM.  Please DO NOT have anything to eat or drink 6 hours prior to this test.   We have scheduled you an appointement to have a MUGA scan here at [facility] (in [place]) on February 16, 2014 at 2:30 PM.  This test may take 45-60 minutes.   Please return here in 2 weeks for follow-up appointment with Dr. Barnet Glasgow.  Thank you for choosing [facility] to provide your oncology and hematology care.  To afford each patient quality time with our providers, please arrive at least 15 minutes before your scheduled appointment time.  With your help, our goal is to use those 15 minutes to complete the necessary work-up to ensure our physicians have the information they need to help with your evaluation and healthcare recommendations.    Effective January 1st, 2014, we ask that you re-schedule your appointment with our physicians should you arrive 10 or more minutes late for your appointment.  We strive to give you quality time with our providers, and arriving late affects you and other patients whose appointments are after yours.    Again, thank you for choosing [facility].  Our hope is that these requests will decrease the amount of time that you wait before being seen by our physicians.       _____________________________________________________________  Should you have questions after your visit to [facility], please contact  our office at [phone] between the hours of 8:30 a.m. and 5:00 p.m.  Voicemails left after 4:30 p.m. will not be returned until the following business day.  For prescription refill requests, have your pharmacy contact our office with your prescription refill request.

## 2014-02-09 NOTE — Progress Notes (Signed)
[place] A. Barnet [place], M.D.  NEW PATIENT EVALUATION   Name: April Myers Date: 02/10/2014 MRN: [id number] DOB: Feb 14, 1921  PCP: Rosita Fire, MD   REFERRING PHYSICIAN: No ref. provider found  REASON FOR REFERRAL: Recently diagnosed left breast cancer with a remote history of right breast cancer in 2007.     HISTORY OF PRESENT ILLNESS:April Myers is a 80 y.o. female who is referred by her family physician and surgeon for consideration of neoadjuvant therapy for a large left invasive duct cell carcinoma, ER/PR negative, HER-2/neu over expressed. She has noticed swelling of her left breast without significant pain, nipple discharge, or bleeding. She also denies new swelling of the left upper extremity. She denies a bone pain, cough, wheezing, diarrhea, constipation, nausea, vomiting, melena, hematochezia, hematuria, vaginal bleeding, or lower extremity swelling or redness. Broadus John denies any chest pain, PND, orthopnea, or palpitations. She is living in assisted living and is present today with her grandniece as well as a power of attorney from the social service office.   PAST MEDICAL HISTORY:  has a past medical history of Coronary artery disease; Arthritis; Cancer; Anemia; and Diabetes mellitus without complication.   Past oncologic history: Multicentric right breast cancer diagnosed in 2007 after biopsy on 03/05/2006 revealing ER/PR negative HER-2/neu positive disease. She underwent right modified radical mastectomy on 03/21/2006 at which time multicentric disease was found with the largest tumor being 1.6 cm. She was seen by medical oncology at that time and because of her age and ER negativity, no treatment was rendered, including Herceptin. She also underwent a left breast fine-needle aspiration at that time which was benign. She did not receive radiotherapy to the right chest wall.   PAST SURGICAL HISTORY: Past Surgical History    Procedure Laterality Date  . Mastectomy       CURRENT MEDICATIONS: has a current medication list which includes the following prescription(s): amlodipine, clopidogrel, insulin glargine, levothyroxine, loratadine, metformin, simvastatin, and zolpidem.   ALLERGIES: Review of patient's allergies indicates no known allergies.   SOCIAL HISTORY:  reports that she has quit smoking. She does not have any smokeless tobacco history on file.   FAMILY HISTORY: family history is not on file.    REVIEW OF SYSTEMS:  Other than that discussed above is noncontributory.    PHYSICAL EXAM:  height is 5' 3"  (1.6 m) and weight is 217 lb (98.431 kg). Her oral temperature is 98.3 F (36.8 C). Her blood pressure is 157/56 and her pulse is 82. Her respiration is 16.    GENERAL:alert, no distress and comfortable SKIN: skin color, texture, turgor are normal, no rashes or significant lesions EYES: normal, Conjunctiva are pink and non-injected, sclera clear OROPHARYNX:no exudate, no erythema and lips, buccal mucosa, and tongue normal  NECK: supple, thyroid normal size, non-tender, without nodularity CHEST: Status post right mastectomy with no subcutaneous nodules. Hyperpigmentation is not noted. Left breast is replaced by peau d'orange and mass occupying nearly the entire breast but no fixation to the chest wall. No axillary lymph nodes are palpable. LYMPH:  no palpable lymphadenopathy in the cervical, axillary or inguinal LUNGS: clear to auscultation and percussion with normal breathing effort HEART: regular rate & rhythm and no murmurs ABDOMEN:abdomen soft, non-tender and normal bowel sounds MUSCULOSKELETALl:no cyanosis of digits, no clubbing or edema  NEURO: alert & oriented x 3 with fluent speech, no focal motor/sensory deficits    LABORATORY DATA:  Office Visit on 02/09/2014  Component Date Value Ref Range Status  . WBC 02/09/2014 7.8  4.0 - 10.5 K/uL Final  . RBC 02/09/2014 3.81* 3.87 - 5.11  MIL/uL Final  . Hemoglobin 02/09/2014 10.7* 12.0 - 15.0 g/dL Final  . HCT 02/09/2014 32.8* 36.0 - 46.0 % Final  . MCV 02/09/2014 86.1  78.0 - 100.0 fL Final  . MCH 02/09/2014 28.1  26.0 - 34.0 pg Final  . MCHC 02/09/2014 32.6  30.0 - 36.0 g/dL Final  . RDW 02/09/2014 14.5  11.5 - 15.5 % Final  . Platelets 02/09/2014 343  150 - 400 K/uL Final  . Neutrophils Relative % 02/09/2014 73  43 - 77 % Final  . Neutro Abs 02/09/2014 5.7  1.7 - 7.7 K/uL Final  . Lymphocytes Relative 02/09/2014 18  12 - 46 % Final  . Lymphs Abs 02/09/2014 1.4  0.7 - 4.0 K/uL Final  . Monocytes Relative 02/09/2014 8  3 - 12 % Final  . Monocytes Absolute 02/09/2014 0.6  0.1 - 1.0 K/uL Final  . Eosinophils Relative 02/09/2014 1  0 - 5 % Final  . Eosinophils Absolute 02/09/2014 0.1  0.0 - 0.7 K/uL Final  . Basophils Relative 02/09/2014 0  0 - 1 % Final  . Basophils Absolute 02/09/2014 0.0  0.0 - 0.1 K/uL Final  . Sodium 02/09/2014 136* 137 - 147 mEq/L Final  . Potassium 02/09/2014 4.2  3.7 - 5.3 mEq/L Final  . Chloride 02/09/2014 96  96 - 112 mEq/L Final  . CO2 02/09/2014 28  19 - 32 mEq/L Final  . Glucose, Bld 02/09/2014 159* 70 - 99 mg/dL Final  . BUN 02/09/2014 15  6 - 23 mg/dL Final  . Creatinine, Ser 02/09/2014 0.87  0.50 - 1.10 mg/dL Final  . Calcium 02/09/2014 9.0  8.4 - 10.5 mg/dL Final  . Total Protein 02/09/2014 8.0  6.0 - 8.3 g/dL Final  . Albumin 02/09/2014 3.0* 3.5 - 5.2 g/dL Final  . AST 02/09/2014 51* 0 - 37 U/L Final  . ALT 02/09/2014 46* 0 - 35 U/L Final  . Alkaline Phosphatase 02/09/2014 82  39 - 117 U/L Final  . Total Bilirubin 02/09/2014 0.2* 0.3 - 1.2 mg/dL Final  . GFR calc non Af Amer 02/09/2014 57* >90 mL/min Final  . GFR calc Af Amer 02/09/2014 66* >90 mL/min Final   Comment: (NOTE)                          The eGFR has been calculated using the CKD EPI equation.                          This calculation has not been validated in all clinical situations.                          eGFR's  persistently <90 mL/min signify possible Chronic Kidney                          Disease.  . CEA 02/09/2014 2.0  0.0 - 5.0 ng/mL Final   Performed at [facility]  . CA 27.29 02/09/2014 28  0 - 39 U/mL Final   Performed at [facility]    Urinalysis No results found for this basename: colorurine,  appearanceur,  labspec,  phurine,  glucoseu,  hgbur,  bilirubinur,  ketonesur,  proteinur,  urobilinogen,  nitrite,  leukocytesur      @RADIOGRAPHY : [place] Core Biopsy  01/26/2014   ADDENDUM REPORT: 01/26/2014 11:49  ADDENDUM: Pathology for the left breast mass at 12 o'clock demonstrates invasive ductal carcinoma. Pathology for the biopsied mass in the retroareolar left breast demonstrates DCIS. These results are concordant. Surgical consultation is recommended. The patient is under the care of Dr. Arnoldo Morale. These results were discussed with April Myers, the patient's health care power-of-attorney, the morning of 01/26/2014. She states that the patient has no complaints regarding her biopsy sites. Additionally, she states that she will call Dr. Arnoldo Morale today.   Electronically Signed   By: Donavan Burnet M.D.   On: 01/26/2014 11:49   01/26/2014   CLINICAL DATA:  Patient with suspicious left breast calcifications and masses.  EXAM: ULTRASOUND GUIDED LEFT BREAST CORE NEEDLE BIOPSY WITH VACUUM ASSIST  COMPARISON:  Previous exams.  PROCEDURE: I met with the patient and we discussed the procedure of ultrasound-guided biopsy, including benefits and alternatives. We discussed the high likelihood of a successful procedure. We discussed the risks of the procedure including infection, bleeding, tissue injury, clip migration, and inadequate sampling. Informed written consent was given. The usual time-out protocol was performed immediately prior to the procedure.  Using sterile technique and 2% Lidocaine as local anesthetic, under direct ultrasound visualization, a 12 gauge vacuum-assisteddevice was used to  perform biopsy of a mass with calcifications at 12 o'clock in the at left breast and a second mass with calcifications in the retroareolar left breast using a medial to lateral approach. At the conclusion of the procedure, a ribbon shaped tissue marker clip was deployed into the biopsy cavity at 12 o'clock and a wing shaped tissue marker clip was deployed into the biopsy cavity in the retroareolar left breast. Follow-up 2-view mammogram was performed and dictated separately.  IMPRESSION: Ultrasound-guided biopsy of two left breast masses. No apparent complications.  Electronically Signed: By: Donavan Burnet M.D. On: 01/19/2014 16:57   Mm Digital Diagnostic Unilat L  01/19/2014   ADDENDUM REPORT: 01/19/2014 16:54  ADDENDUM: Please note that there is mild skin thickening the inner primarily lower left breast. This corresponds with evidence of edema on physical examination. Findings may indicate a component of inflammatory carcinoma. This was discussed with Dr. Arnoldo Morale the morning and 01/19/2014.   Electronically Signed   By: Donavan Burnet M.D.   On: 01/19/2014 16:54   01/19/2014   CLINICAL DATA:  Status post 2 ultrasound-guided core biopsies of the left breast.  EXAM: DIAGNOSTIC LEFT MAMMOGRAM POST ULTRASOUND BIOPSY  COMPARISON:  Previous exams  FINDINGS: Mammographic images were obtained following 2 ultrasound guided biopsies of left breast masses at 12 o'clock and in the retroareolar region. Both the ribbon shaped clip used for the 12 o'clock mass and the wing shaped clip for the retroareolar mass are located more laterally than expected.  IMPRESSION: Two clips placed following ultrasound-guided biopsies of left breast masses. Both clips are more lateral than anticipated.  Final Assessment: Post Procedure Mammograms for Marker Placement  Electronically Signed: By: Donavan Burnet M.D. On: 01/19/2014 16:44   [place] Lt Breast Bx W Loc Dev Ea Add Lesion Img Bx Spec US Guide  01/26/2014   ADDENDUM  REPORT: 01/26/2014 11:49  ADDENDUM: Pathology for the left breast mass at 12 o'clock demonstrates invasive ductal carcinoma. Pathology for the biopsied mass in the retroareolar left breast demonstrates DCIS. These results are concordant. Surgical consultation is recommended. The patient is under the care of Dr. Arnoldo Morale. These  results were discussed with April Myers, the patient's health care power-of-attorney, the morning of 01/26/2014. She states that the patient has no complaints regarding her biopsy sites. Additionally, she states that she will call Dr. Arnoldo Morale today.   Electronically Signed   By: Donavan Burnet M.D.   On: 01/26/2014 11:49   01/26/2014   CLINICAL DATA:  Patient with suspicious left breast calcifications and masses.  EXAM: ULTRASOUND GUIDED LEFT BREAST CORE NEEDLE BIOPSY WITH VACUUM ASSIST  COMPARISON:  Previous exams.  PROCEDURE: I met with the patient and we discussed the procedure of ultrasound-guided biopsy, including benefits and alternatives. We discussed the high likelihood of a successful procedure. We discussed the risks of the procedure including infection, bleeding, tissue injury, clip migration, and inadequate sampling. Informed written consent was given. The usual time-out protocol was performed immediately prior to the procedure.  Using sterile technique and 2% Lidocaine as local anesthetic, under direct ultrasound visualization, a 12 gauge vacuum-assisteddevice was used to perform biopsy of a mass with calcifications at 12 o'clock in the at left breast and a second mass with calcifications in the retroareolar left breast using a medial to lateral approach. At the conclusion of the procedure, a ribbon shaped tissue marker clip was deployed into the biopsy cavity at 12 o'clock and a wing shaped tissue marker clip was deployed into the biopsy cavity in the retroareolar left breast. Follow-up 2-view mammogram was performed and dictated separately.  IMPRESSION: Ultrasound-guided  biopsy of two left breast masses. No apparent complications.  Electronically Signed: By: Donavan Burnet M.D. On: 01/19/2014 16:57   [place] Lt Plc Breast Loc Dev   1st Lesion  Inc US Guide  01/12/2014   CLINICAL DATA:  Patient with left breast microcalcifications and masses concerning for carcinoma on recent examination.  EXAM: ULTRASOUND GUIDED LEFT BREAST CORE NEEDLE BIOPSY WITH VACUUM ASSIST  COMPARISON:  Previous exams.  PROCEDURE: I met with the patient as well as her caregiver and health care power-of-attorney. We discussed the procedure of ultrasound-guided biopsy, including benefits and alternatives. We discussed the high likelihood of a successful procedure. We discussed the risks of the procedure including infection, bleeding, tissue injury, clip migration, and inadequate sampling. Informed written consent was given. The usual time-out protocol was performed immediately prior to the procedure.  Additionally, before the procedure, a list of medications was provided for [place]. This list included no anticoagulants. The patient's caregiver and health care power-of-attorney verbally indicated that they had no knowledge of anticoagulant use by the patient. Using sterile technique and 2% Lidocaine as local anesthetic, under direct ultrasound visualization, a 12 gauge vacuum-assisteddevice was used to perform a biopsy of a mass at 2:30, 6 cm from the nipple using a inferior to superior approach. No tissue was obtained from this biopsy, only blood. The mass may represent a hematoma. Since no tissue was sent to pathology, no clip was placed at the biopsy site. At this point, there was more bleeding than expected from the biopsy site. The procedure was terminated before a second biopsy of a mass at 12:00 containing calcifications could be performed. Further investigation into the patient's medications was done. A new list of medications was obtained, which included Clopidogrel. The procedure was terminated and  pressure was held at the biopsy site until the patient stopped bleeding. No hematoma was palpated when the patient left the department. It was recommended that the patient see her physician to determine if she can stop Clopidogrel for 5 days prior to a second  attempt at biopsy. All of this was explained to the patient and her health care power-of-attorney.  IMPRESSION: Ultrasound guided biopsy procedure was terminated, as described above. After the patient stops her Clopidogrel, a repeat attempt at biopsy of a left breast mass is recommended.   Electronically Signed   By: Donavan Burnet M.D.   On: 01/12/2014 14:53    PATHOLOGY:  for April Myers, April Myers ([id number]) Patient: April Myers, April Myers Collected: 01/19/2014 Client: [facility] Accession: [id number] Received: 01/20/2014 Maryella Shivers, MD DOB: [birth year] Age: 80 Gender: F Reported: 01/24/2014 [address] Patient Ph: [phone] MRN #: [id number] Linna Hoff [place] [address] Visit #: [id number] Chart #: Phone: Fax: CC: [facility] REPORT OF SURGICAL PATHOLOGY ADDITIONAL INFORMATION: 1. PROGNOSTIC INDICATORS - ACIS Results: IMMUNOHISTOCHEMICAL AND MORPHOMETRIC ANALYSIS BY THE AUTOMATED CELLULAR IMAGING SYSTEM (ACIS) Estrogen Receptor: 0%, NEGATIVE Progesterone Receptor: 0%, NEGATIVE Proliferation Marker Ki67: 74% REFERENCE RANGE ESTROGEN RECEPTOR NEGATIVE <1% POSITIVE =>1% PROGESTERONE RECEPTOR NEGATIVE <1% POSITIVE =>1% All controls stained appropriately Enid Cutter MD Pathologist, Electronic Signature ( Signed 01/27/2014) 1. CHROMOGENIC IN-SITU HYBRIDIZATION Results: HER2/NEU BY CISH - SHOWS AMPLIFICATION BY CISH ANALYSIS. RESULT RATIO OF HER2: CEP 17 SIGNALS 5.71 AVERAGE HER2 COPY NUMBER PER CELL 8.00 REFERENCE RANGE 1 of 3 FINAL for April Myers, April Myers ([id number]) ADDITIONAL INFORMATION:(continued) NEGATIVE HER2/Chr17 Ratio <2.0 and Average HER2 copy number <4.0 EQUIVOCAL HER2/Chr17 Ratio <2.0 and Average HER2  copy number 4.0 and <6.0 POSITIVE HER2/Chr17 Ratio >=2.0 and/or Average HER2 copy number >=6.0 Enid Cutter MD Pathologist, Electronic Signature ( Signed 01/25/2014) FINAL DIAGNOSIS Diagnosis 1. Breast, left, needle core biopsy, 12:00 - INVASIVE DUCTAL CARCINOMA, SEE COMMENT. - DUCTAL CARCINOMA IN SITU. 2. Breast, left, needle core biopsy, subareolar - DUCTAL CARCINOMA IN SITU. Microscopic Comment 1. Although the type and grade is best determined at the time of excision, there is an invasive ductal carcinoma that appears to be high grade. 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 No angiolymphatic invasion is identified. Dr. Gari Crown agrees. Breast prognostic profile will be performed and an addendum will follow. 2. Immunostains for myoepithelial markers calpionin, p63 and muscle actin were performed and the stains confirmed the above interpretation. Aldona Bar MD Pathologist, Electronic Signature (Case signed 01/24/2014) Specimen Gross and Clinical Information Specimen(s) Obtained: 1. Breast, left, needle core biopsy, 12:00 2. Breast, left, needle core biopsy, subareolar Specimen Clinical Information 2. highly suspicious left breast calcifications and masses; ? IMC (large hematoma component to biopsy) Gross 1. Received in formalin are three cores of soft tan yellow tissue measuring 1.3 to 1.7 cm in length and each 0.2 cm in diameter. The specimen is submitted in toto. 2. Received in formalin are three cores of soft tan yellow to hemorrhagic tissue measuring 0.7 to 1 cm in length and each 0.2 cm in diameter. The specimen is submitted in toto. (GP:caf 01/20/14) Stain(s) used in Diagnosis: The following stain(s) were used in diagnosing the case: P63, PR-ACIS, KI-67-ACIS, CISH, ER-ACIS, Smooth Muscle Myosin - 1 Heavy Chain, Calponin. The control(s) stained appropriately. 2 of 3 FINAL for April Myers, April Myers ([id number]) Disclaimer Her2Neu by CISH (chromogenic in-situ  hybridization) is performed at [facility], using the [place] pharmDx Kit (code number [id number]) Estrogen receptor (SP1), immunohistochemical stains are performed on formalin fixed, paraffin embedded tissue using a 3,3"-diaminobenzidine (DAB) chromogen and [place]. The staining intensity of the nucleus is scored morphometrically using the Automated Cellular Imaging System (ACIS) and is reported as the percentage of tumor cell nuclei demonstrating  specific nuclear staining. Ki-67 (Mib-1), immunohistochemical stains are performed on formalin fixed, paraffin embedded tissue using a 3,3"-diaminobenzidine (DAB) chromogen and [place]. The staining intensity of the nucleus is scored morphometrically using the Automated Cellular Imaging System (ACIS) and is reported as the percentage of tumor cell nuclei demonstrating specific nuclear staining. PR progesterone receptor (PgR 636), immunohistochemical stains are performed on formalin fixed, paraffin embedded tissue using a 3,3"-diaminobenzidine (DAB) chromogen and [employer] Aut[employer] System. The staining intensity of the nucleus is scored morphometrically using the Automated Cellular Imaging System (ACIS) and is reported as the percentage of tumor cell nuclei demonstrating specific nuclear staining. Some of these immunohistochemical stains may have been developed and the performance characteristics determined by [facility]. Some may not have been cleared or approved by the U.S. Food and Drug Administration. The FDA has determined that such clearance or approval is not necessary. This test is used for clinical purposes. It should not be regarded as investigational or for research. This laboratory is certified under the [place] (CLIA-88) as qualified to perform high complexity clinical laboratory testing. Report signed out from the following location(s) Technical  Component performed at [place], [place] [address] CLIA: [id number]., Technical Component performed at [facility]. [address],[place] [address].KBTC:[id number]., Technical Component performed at [place], [place], [place] [address]. CLIA #: [id number], Interpretation performed at [place], [place],  [address]. CLIA #: [id number],    IMPRESSION:  #1. Inflammatory left breast cancer, ER/PR negative, HER-2/neu overexpressed. #2. History of right breast cancer, multicentric with noninvasive ductal neoplasia as well, status post right modified radical mastectomy in 2007 receiving no additional treatment, ER/PR negative, HER-2/neu overexpressed. #3. Type 2 diabetes mellitus, insulin requiring. #4. Hypothyroidism, on treatment. #5. Peripheral vascular disease, currently on Plavix.   PLAN:  #1. A discussion was held with the social worker representing her power of attorney as well as a grandniece and the patient herself. She does not want surgery or radiation treatment. She was offered combination treatment utilizing chemotherapy with anti-HER-2/neu antibodies with a promised to not produce any additional sickness. #2. Determine the extent of disease with PET scan. #3. Baseline MUGA scan. #4. Life port insertion by Dr. Arnoldo Morale. #5. Office visit in 2 weeks for further discussion based upon what the PET scan shows. Leaning towards pertuzumab/trastuzumab in conjunction with either weekly Taxol or weekly  Navelbine to begin with. #6. All are in agreement with this strategy. It was stressed to the patient that should she desire to stop treatment at anytime, her wish will be honored. She is willing to pursue the above at this point in time.  I appreciate the opportunity sharing in her care.   Doroteo Bradford, MD 02/10/2014 11:35 AM

## 2014-02-10 LAB — CANCER ANTIGEN 27.29: CA 27.29: 28 U/mL (ref 0–39)

## 2014-02-10 LAB — CEA: CEA: 2 ng/mL (ref 0.0–5.0)

## 2014-02-16 ENCOUNTER — Encounter (HOSPITAL_COMMUNITY): Payer: Self-pay

## 2014-02-16 ENCOUNTER — Encounter (HOSPITAL_COMMUNITY)
Admission: RE | Admit: 2014-02-16 | Discharge: 2014-02-16 | Disposition: A | Discharge status: Home / Self Care | Payer: Medicare Other | Source: Ambulatory Visit | Attending: Hematology and Oncology | Admitting: Hematology and Oncology

## 2014-02-16 ENCOUNTER — Other Ambulatory Visit: Payer: Self-pay

## 2014-02-16 ENCOUNTER — Encounter (HOSPITAL_COMMUNITY)
Admission: RE | Admit: 2014-02-16 | Discharge: 2014-02-16 | Disposition: A | Discharge status: Home / Self Care | Payer: Medicare Other | Source: Ambulatory Visit | Attending: General Surgery | Admitting: General Surgery

## 2014-02-16 ENCOUNTER — Ambulatory Visit (HOSPITAL_COMMUNITY)
Admission: RE | Admit: 2014-02-16 | Discharge: 2014-02-16 | Disposition: A | Discharge status: Home / Self Care | Payer: Medicare Other | Source: Ambulatory Visit | Attending: General Surgery | Admitting: General Surgery

## 2014-02-16 VITALS — BP 177/84 | HR 73 | Temp 97.9°F | Resp 16 | Ht 63.0 in | Wt 228.0 lb

## 2014-02-16 DIAGNOSIS — C50919 Malignant neoplasm of unspecified site of unspecified female breast: Secondary | ICD-10-CM | POA: Insufficient documentation

## 2014-02-16 DIAGNOSIS — I517 Cardiomegaly: Secondary | ICD-10-CM | POA: Insufficient documentation

## 2014-02-16 DIAGNOSIS — R918 Other nonspecific abnormal finding of lung field: Secondary | ICD-10-CM | POA: Insufficient documentation

## 2014-02-16 DIAGNOSIS — C50912 Malignant neoplasm of unspecified site of left female breast: Secondary | ICD-10-CM

## 2014-02-16 HISTORY — DX: Unspecified dementia, unspecified severity, without behavioral disturbance, psychotic disturbance, mood disturbance, and anxiety: F03.90

## 2014-02-16 HISTORY — DX: Essential (primary) hypertension: I10

## 2014-02-16 HISTORY — DX: Hypothyroidism, unspecified: E03.9

## 2014-02-16 HISTORY — DX: Anxiety disorder, unspecified: F41.9

## 2014-02-16 HISTORY — DX: Pure hypercholesterolemia, unspecified: E78.00

## 2014-02-16 HISTORY — DX: Unspecified cataract: H26.9

## 2014-02-16 HISTORY — DX: Other specified health status: Z78.9

## 2014-02-16 MED ORDER — HEPARIN SOD (PORK) LOCK FLUSH 100 UNIT/ML IV SOLN
INTRAVENOUS | Status: AC
  Administered 2014-02-16: 50 [IU]
  Filled 2014-02-16: qty 5

## 2014-02-16 MED ORDER — TECHNETIUM TC 99M-LABELED RED BLOOD CELLS IV KIT
25.0000 | PACK | Freq: Once | INTRAVENOUS | Status: AC | PRN
  Administered 2014-02-16: 25 via INTRAVENOUS

## 2014-02-16 NOTE — Patient Instructions (Signed)
April Myers  02/16/2014   Your procedure is scheduled on:  02/21/2014  Report to [facility] at  66  AM.  Call this number if you have problems the morning of surgery: [phone]   Remember:   Do not eat food or drink liquids after midnight.   Take these medicines the morning of surgery with A SIP OF WATER: norvasc, levothyroxine, claritin. Take 1/2 of your Lantus dosage the night before your surgery.  Do not wear jewelry, make-up or nail polish.  Do not wear lotions, powders, or perfumes.   Do not shave 48 hours prior to surgery. Men may shave face and neck.  Do not bring valuables to the hospital.  [facility] is not responsible for any belongings or valuables.               Contacts, dentures or bridgework may not be worn into surgery.  Leave suitcase in the car. After surgery it may be brought to your room.  For patients admitted to the hospital, discharge time is determined by your treatment team.               Patients discharged the day of surgery will not be allowed to drive home.  Name and phone number of your driver: family  Special Instructions: Shower using CHG 2 nights before surgery and the night before surgery.  If you shower the day of surgery use CHG.  Use special wash - you have one bottle of CHG for all showers.  You should use approximately 1/3 of the bottle for each shower.   Please read over the following fact sheets that you were given: Pain Booklet, Coughing and Deep Breathing, Surgical Site Infection Prevention, Anesthesia Post-op Instructions and Care and Recovery After Surgery Implanted [facility] Guide An implanted port is a type of central line that is placed under the skin. Central lines are used to provide IV access when treatment or nutrition needs to be given through a person's veins. Implanted ports are used for long-term IV access. An implanted port may be placed because:   You need IV medicine that would be irritating to the small veins in your  hands or arms.   You need long-term IV medicines, such as antibiotics.   You need IV nutrition for a long period.   You need frequent blood draws for lab tests.   You need dialysis.  Implanted ports are usually placed in the chest area, but they can also be placed in the upper arm, the abdomen, or the leg. An implanted port has two main parts:   Reservoir. The reservoir is round and will appear as a small, raised area under your skin. The reservoir is the part where a needle is inserted to give medicines or draw blood.   Catheter. The catheter is a thin, flexible tube that extends from the reservoir. The catheter is placed into a large vein. Medicine that is inserted into the reservoir goes into the catheter and then into the vein.  HOW WILL I CARE FOR MY INCISION SITE? Do not get the incision site wet. Bathe or shower as directed by your health care provider.  HOW IS MY PORT ACCESSED? Special steps must be taken to access the port:   Before the port is accessed, a numbing cream can be placed on the skin. This helps numb the skin over the port site.   Your health care provider uses a sterile technique to access  the port.  Your health care provider must put on a mask and sterile gloves.  The skin over your port is cleaned carefully with an antiseptic and allowed to dry.  The port is gently pinched between sterile gloves, and a needle is inserted into the port.  Only "non-coring" port needles should be used to access the port. Once the port is accessed, a blood return should be checked. This helps ensure that the port is in the vein and is not clogged.   If your port needs to remain accessed for a constant infusion, a clear (transparent) bandage will be placed over the needle site. The bandage and needle will need to be changed every week, or as directed by your health care provider.   Keep the bandage covering the needle clean and dry. Do not get it wet. Follow your health  care provider's instructions on how to take a shower or bath while the port is accessed.   If your port does not need to stay accessed, no bandage is needed over the port.  WHAT IS FLUSHING? Flushing helps keep the port from getting clogged. Follow your health care provider's instructions on how and when to flush the port. Ports are usually flushed with saline solution or a medicine called heparin. The need for flushing will depend on how the port is used.   If the port is used for intermittent medicines or blood draws, the port will need to be flushed:   After medicines have been given.   After blood has been drawn.   As part of routine maintenance.   If a constant infusion is running, the port may not need to be flushed.  HOW LONG WILL MY PORT STAY IMPLANTED? The port can stay in for as long as your health care provider thinks it is needed. When it is time for the port to come out, surgery will be done to remove it. The procedure is similar to the one performed when the port was put in.  WHEN SHOULD I SEEK IMMEDIATE MEDICAL CARE? When you have an implanted port, you should seek immediate medical care if:   You notice a bad smell coming from the incision site.   You have swelling, redness, or drainage at the incision site.   You have more swelling or pain at the port site or the surrounding area.   You have a fever that is not controlled with medicine. Document Released: 12/02/2005 Document Revised: 09/22/2013 Document Reviewed: 08/09/2013 [facility] Patient Information 2014 [place]. PATIENT INSTRUCTIONS POST-ANESTHESIA  IMMEDIATELY FOLLOWING SURGERY:  Do not drive or operate machinery for the first twenty four hours after surgery.  Do not make any important decisions for twenty four hours after surgery or while taking narcotic pain medications or sedatives.  If you develop intractable nausea and vomiting or a severe headache please notify your doctor  immediately.  FOLLOW-UP:  Please make an appointment with your surgeon as instructed. You do not need to follow up with anesthesia unless specifically instructed to do so.  WOUND CARE INSTRUCTIONS (if applicable):  Keep a dry clean dressing on the anesthesia/puncture wound site if there is drainage.  Once the wound has quit draining you may leave it open to air.  Generally you should leave the bandage intact for twenty four hours unless there is drainage.  If the epidural site drains for more than 36-48 hours please call the anesthesia department.  QUESTIONS?:  Please feel free to call your physician or the  hospital operator if you have any questions, and they will be happy to assist you.

## 2014-02-16 NOTE — Progress Notes (Signed)
02/16/14 1301  OBSTRUCTIVE SLEEP APNEA  Have you ever been diagnosed with sleep apnea through a sleep study? No  Do you snore loudly (loud enough to be heard through closed doors)?  0  Do you often feel tired, fatigued, or sleepy during the daytime? 1  Has anyone observed you stop breathing during your sleep? 0  Do you have, or are you being treated for high blood pressure? 1  BMI more than 35 kg/m2? 1  Age over 78 years old? 1  Neck circumference greater than 40 cm/18 inches? 0  Gender: 0  Obstructive Sleep Apnea Score 4  Score 4 or greater  Results sent to PCP

## 2014-02-16 NOTE — H&P (Signed)
April Myers is an 80 y.o. female.   Chief Complaint: Left breast carcinoma HPI: Patient is Myers 80 year old black female [facility] of [facility] who was found [facility] left breast biopsy to have Myers left breast carcinoma. She is about to undergo neoadjuvant therapy and presents for Myers Port-Myers-Cath insertion. She is [place] ward of the state and her power of attorney was present during the examination.  Past Medical History  Diagnosis Date  . Coronary artery disease   . Arthritis   . Cancer   . Anemia   . Diabetes mellitus without complication     Past Surgical History  Procedure Laterality Date  . Mastectomy      No family history on file. Social History:  reports that she has quit smoking. She does not have any smokeless tobacco history on file. Her alcohol and drug histories are not on file.  Allergies: No Known Allergies  No prescriptions prior to admission    No results found for this or any previous visit (from the past 48 hour(s)). No results found.  Review of Systems  Constitutional: Negative.   HENT: Negative.   Eyes: Negative.   Respiratory: Negative.   Cardiovascular: Negative.   Gastrointestinal: Negative.   Genitourinary: Negative.   Skin: Negative.     There were no vitals taken for this visit. Physical Exam  Constitutional: She appears well-developed and well-nourished.  HENT:  Head: Normocephalic and atraumatic.  Neck: Normal range of motion. Neck supple.  Cardiovascular: Normal rate, regular rhythm and normal heart sounds.   Respiratory: Effort normal and breath sounds normal.  GI: Soft. Bowel sounds are normal.  Neurological: She is alert.  Skin: Skin is warm and dry.     Assessment/Plan Impression: Left breast carcinoma Plan: Patient will have Myers Port-Myers-Cath inserted on 02/21/2014 to the risks and benefits of the procedure were fully explained to the patient and power of attorney, who gave informed consent.  April Myers 02/16/2014, 7:27 AM

## 2014-02-18 ENCOUNTER — Ambulatory Visit (HOSPITAL_COMMUNITY)
Admission: RE | Admit: 2014-02-18 | Discharge: 2014-02-18 | Disposition: A | Discharge status: Home / Self Care | Payer: Medicare Other | Source: Ambulatory Visit | Attending: Hematology and Oncology | Admitting: Hematology and Oncology

## 2014-02-18 DIAGNOSIS — C50912 Malignant neoplasm of unspecified site of left female breast: Secondary | ICD-10-CM

## 2014-02-21 ENCOUNTER — Encounter (HOSPITAL_COMMUNITY): Payer: Self-pay | Admitting: *Deleted

## 2014-02-21 ENCOUNTER — Ambulatory Visit (HOSPITAL_COMMUNITY): Payer: Medicare Other

## 2014-02-21 ENCOUNTER — Encounter (HOSPITAL_COMMUNITY)
Admission: RE | Disposition: A | Discharge status: Home / Self Care | Payer: Self-pay | Source: Ambulatory Visit | Attending: General Surgery

## 2014-02-21 ENCOUNTER — Ambulatory Visit (HOSPITAL_COMMUNITY): Payer: Medicare Other | Admitting: Anesthesiology

## 2014-02-21 ENCOUNTER — Ambulatory Visit (HOSPITAL_COMMUNITY)
Admission: RE | Admit: 2014-02-21 | Discharge: 2014-02-21 | Disposition: A | Discharge status: Home / Self Care | Payer: Medicare Other | Source: Ambulatory Visit | Attending: General Surgery | Admitting: General Surgery

## 2014-02-21 ENCOUNTER — Encounter (HOSPITAL_COMMUNITY): Payer: Medicare Other | Admitting: Anesthesiology

## 2014-02-21 DIAGNOSIS — Z87891 Personal history of nicotine dependence: Secondary | ICD-10-CM | POA: Insufficient documentation

## 2014-02-21 DIAGNOSIS — I10 Essential (primary) hypertension: Secondary | ICD-10-CM | POA: Diagnosis not present

## 2014-02-21 DIAGNOSIS — C50919 Malignant neoplasm of unspecified site of unspecified female breast: Secondary | ICD-10-CM | POA: Insufficient documentation

## 2014-02-21 DIAGNOSIS — E119 Type 2 diabetes mellitus without complications: Secondary | ICD-10-CM | POA: Insufficient documentation

## 2014-02-21 DIAGNOSIS — Z794 Long term (current) use of insulin: Secondary | ICD-10-CM | POA: Insufficient documentation

## 2014-02-21 DIAGNOSIS — Z79899 Other long term (current) drug therapy: Secondary | ICD-10-CM | POA: Insufficient documentation

## 2014-02-21 DIAGNOSIS — F411 Generalized anxiety disorder: Secondary | ICD-10-CM | POA: Insufficient documentation

## 2014-02-21 HISTORY — PX: PORTACATH PLACEMENT: SHX2246

## 2014-02-21 LAB — GLUCOSE, CAPILLARY
GLUCOSE-CAPILLARY: 132 mg/dL — AB (ref 70–99)
Glucose-Capillary: 116 mg/dL — ABNORMAL HIGH (ref 70–99)

## 2014-02-21 SURGERY — INSERTION, TUNNELED CENTRAL VENOUS DEVICE, WITH PORT
Anesthesia: Monitor Anesthesia Care

## 2014-02-21 MED ORDER — LIDOCAINE HCL (PF) 1 % IJ SOLN
INTRAMUSCULAR | Status: AC
  Filled 2014-02-21: qty 5

## 2014-02-21 MED ORDER — EPHEDRINE SULFATE 50 MG/ML IJ SOLN
INTRAMUSCULAR | Status: DC | PRN
  Administered 2014-02-21: 10 mg via INTRAVENOUS

## 2014-02-21 MED ORDER — HEPARIN SOD (PORK) LOCK FLUSH 100 UNIT/ML IV SOLN
INTRAVENOUS | Status: AC
  Filled 2014-02-21: qty 5

## 2014-02-21 MED ORDER — FENTANYL CITRATE 0.05 MG/ML IJ SOLN
INTRAMUSCULAR | Status: AC
  Filled 2014-02-21: qty 2

## 2014-02-21 MED ORDER — HEPARIN SOD (PORK) LOCK FLUSH 100 UNIT/ML IV SOLN
INTRAVENOUS | Status: DC | PRN
  Administered 2014-02-21: 500 [IU] via INTRAVENOUS

## 2014-02-21 MED ORDER — LACTATED RINGERS IV SOLN
INTRAVENOUS | Status: DC
  Administered 2014-02-21: 09:00:00 via INTRAVENOUS

## 2014-02-21 MED ORDER — FENTANYL CITRATE 0.05 MG/ML IJ SOLN
25.0000 ug | INTRAMUSCULAR | Status: AC
  Administered 2014-02-21 (×2): 25 ug via INTRAVENOUS

## 2014-02-21 MED ORDER — ONDANSETRON HCL 4 MG/2ML IJ SOLN: 4.0000 mg | Freq: Once | INTRAMUSCULAR | Status: DC | PRN

## 2014-02-21 MED ORDER — PROPOFOL 10 MG/ML IV BOLUS
INTRAVENOUS | Status: AC
  Filled 2014-02-21: qty 20

## 2014-02-21 MED ORDER — ONDANSETRON HCL 4 MG/2ML IJ SOLN
INTRAMUSCULAR | Status: AC
  Filled 2014-02-21: qty 2

## 2014-02-21 MED ORDER — ATROPINE SULFATE 1 MG/ML IJ SOLN
INTRAMUSCULAR | Status: AC
  Filled 2014-02-21: qty 1

## 2014-02-21 MED ORDER — LACTATED RINGERS IV SOLN
INTRAVENOUS | Status: DC | PRN
  Administered 2014-02-21: 09:00:00 via INTRAVENOUS

## 2014-02-21 MED ORDER — ONDANSETRON HCL 4 MG/2ML IJ SOLN
4.0000 mg | Freq: Once | INTRAMUSCULAR | Status: AC
  Administered 2014-02-21: 4 mg via INTRAVENOUS

## 2014-02-21 MED ORDER — CHLORHEXIDINE GLUCONATE 4 % EX LIQD: 1.0000 "application " | Freq: Once | CUTANEOUS | Status: DC

## 2014-02-21 MED ORDER — MIDAZOLAM HCL 5 MG/5ML IJ SOLN
INTRAMUSCULAR | Status: DC | PRN
  Administered 2014-02-21 (×2): 1 mg via INTRAVENOUS

## 2014-02-21 MED ORDER — SODIUM CHLORIDE BACTERIOSTATIC 0.9 % IJ SOLN
INTRAMUSCULAR | Status: AC
  Filled 2014-02-21: qty 10

## 2014-02-21 MED ORDER — ACETAMINOPHEN 325 MG PO TABS: 650.0000 mg | ORAL_TABLET | Freq: Once | ORAL | Status: DC

## 2014-02-21 MED ORDER — SODIUM CHLORIDE 0.9 % IV SOLN
INTRAVENOUS | Status: DC | PRN
  Administered 2014-02-21: 500 mL via INTRAMUSCULAR

## 2014-02-21 MED ORDER — MIDAZOLAM HCL 2 MG/2ML IJ SOLN
1.0000 mg | INTRAMUSCULAR | Status: DC | PRN
  Administered 2014-02-21: 2 mg via INTRAVENOUS

## 2014-02-21 MED ORDER — FENTANYL CITRATE 0.05 MG/ML IJ SOLN: 25.0000 ug | INTRAMUSCULAR | Status: DC | PRN

## 2014-02-21 MED ORDER — LIDOCAINE HCL (CARDIAC) 10 MG/ML IV SOLN
INTRAVENOUS | Status: DC | PRN
  Administered 2014-02-21: 50 mg via INTRAVENOUS

## 2014-02-21 MED ORDER — PROPOFOL INFUSION 10 MG/ML OPTIME
INTRAVENOUS | Status: DC | PRN
  Administered 2014-02-21: 25 ug/kg/min via INTRAVENOUS

## 2014-02-21 MED ORDER — LIDOCAINE HCL (PF) 1 % IJ SOLN
INTRAMUSCULAR | Status: AC
  Filled 2014-02-21: qty 30

## 2014-02-21 MED ORDER — CEFAZOLIN SODIUM-DEXTROSE 2-3 GM-% IV SOLR
2.0000 g | INTRAVENOUS | Status: AC
  Administered 2014-02-21: 2 g via INTRAVENOUS
  Filled 2014-02-21: qty 50

## 2014-02-21 MED ORDER — EPHEDRINE SULFATE 50 MG/ML IJ SOLN
INTRAMUSCULAR | Status: AC
  Filled 2014-02-21: qty 1

## 2014-02-21 MED ORDER — MIDAZOLAM HCL 2 MG/2ML IJ SOLN
INTRAMUSCULAR | Status: AC
  Filled 2014-02-21: qty 2

## 2014-02-21 MED ORDER — HYDROCODONE-ACETAMINOPHEN 5-325 MG PO TABS: 1.0000 | ORAL_TABLET | Freq: Four times a day (QID) | ORAL | Status: AC | PRN

## 2014-02-21 MED ORDER — LIDOCAINE HCL (PF) 1 % IJ SOLN
INTRAMUSCULAR | Status: DC | PRN
  Administered 2014-02-21: 16 mL

## 2014-02-21 SURGICAL SUPPLY — 32 items
ADH SKN CLS APL DERMABOND .7 (GAUZE/BANDAGES/DRESSINGS) ×1
BAG DECANTER FOR FLEXI CONT (MISCELLANEOUS) ×3 IMPLANT
BAG HAMPER (MISCELLANEOUS) ×3 IMPLANT
CLOTH BEACON ORANGE TIMEOUT ST (SAFETY) ×3 IMPLANT
COVER LIGHT HANDLE STERIS (MISCELLANEOUS) ×6 IMPLANT
DECANTER SPIKE VIAL GLASS SM (MISCELLANEOUS) ×3 IMPLANT
DERMABOND ADVANCED (GAUZE/BANDAGES/DRESSINGS) ×2
DERMABOND ADVANCED .7 DNX12 (GAUZE/BANDAGES/DRESSINGS) ×1 IMPLANT
DRAPE C-ARM FOLDED MOBILE STRL (DRAPES) ×3 IMPLANT
DURAPREP 6ML APPLICATOR 50/CS (WOUND CARE) ×3 IMPLANT
ELECT REM PT RETURN 9FT ADLT (ELECTROSURGICAL) ×3
ELECTRODE REM PT RTRN 9FT ADLT (ELECTROSURGICAL) ×1 IMPLANT
GAUZE SPONGE 4X4 16PLY XRAY LF (GAUZE/BANDAGES/DRESSINGS) ×2 IMPLANT
GLOVE BIO SURGEON STRL SZ7.5 (GLOVE) ×3 IMPLANT
GLOVE BIOGEL PI IND STRL 7.0 (GLOVE) IMPLANT
GLOVE BIOGEL PI INDICATOR 7.0 (GLOVE) ×4
GLOVE ECLIPSE 6.5 STRL STRAW (GLOVE) ×2 IMPLANT
GOWN STRL REUS W/TWL LRG LVL3 (GOWN DISPOSABLE) ×6 IMPLANT
IV NS 500ML (IV SOLUTION) ×3
IV NS 500ML BAXH (IV SOLUTION) ×1 IMPLANT
KIT PORT POWER 8FR ISP MRI (CATHETERS) ×3 IMPLANT
KIT ROOM TURNOVER APOR (KITS) ×3 IMPLANT
MANIFOLD NEPTUNE II (INSTRUMENTS) ×3 IMPLANT
PACK MINOR (CUSTOM PROCEDURE TRAY) ×3 IMPLANT
PAD ARMBOARD 7.5X6 YLW CONV (MISCELLANEOUS) ×3 IMPLANT
SET BASIN LINEN APH (SET/KITS/TRAYS/PACK) ×3 IMPLANT
SUT VIC AB 3-0 SH 27 (SUTURE) ×3
SUT VIC AB 3-0 SH 27X BRD (SUTURE) ×1 IMPLANT
SUT VIC AB 4-0 PS2 27 (SUTURE) ×3 IMPLANT
SYR 20CC LL (SYRINGE) ×3 IMPLANT
SYR CONTROL 10ML LL (SYRINGE) ×3 IMPLANT
SYRINGE 10CC LL (SYRINGE) ×3 IMPLANT

## 2014-02-21 NOTE — Anesthesia Procedure Notes (Signed)
Procedure Name: MAC Date/Time: 02/21/2014 9:53 AM Performed by: Andree Elk, Contessa Preuss A Pre-anesthesia Checklist: Patient identified, Timeout performed, Emergency Drugs available, Suction available and Patient being monitored Oxygen Delivery Method: Non-rebreather mask

## 2014-02-21 NOTE — Anesthesia Postprocedure Evaluation (Signed)
  Anesthesia Post-op Note  Patient: April Myers  Procedure(s) Performed: Procedure(s): INSERTION PORT-A-CATH RIGHT SUBCLAVIAN (N/A)  Patient Location: PACU  Anesthesia Type:MAC  Level of Consciousness: awake, alert  and patient cooperative  Airway and Oxygen Therapy: Patient Spontanous Breathing  Post-op Pain: none  Post-op Assessment: Post-op Vital signs reviewed, Patient's Cardiovascular Status Stable, Respiratory Function Stable, Patent Airway, No signs of Nausea or vomiting and Pain level controlled  Post-op Vital Signs: Reviewed and stable  Complications: No apparent anesthesia complications

## 2014-02-21 NOTE — Transfer of Care (Signed)
Immediate Anesthesia Transfer of Care Note  Patient: April Myers  Procedure(s) Performed: Procedure(s): INSERTION PORT-A-CATH RIGHT SUBCLAVIAN (N/A)  Patient Location: PACU  Anesthesia Type:MAC  Level of Consciousness: awake, alert , patient cooperative and confused  Airway & Oxygen Therapy: Patient Spontanous Breathing  Post-op Assessment: Report given to PACU RN and Post -op Vital signs reviewed and stable  Post vital signs: Reviewed and stable  Complications: No apparent anesthesia complications

## 2014-02-21 NOTE — Op Note (Signed)
Patient:  April Myers  DOB:  1921/10/19  MRN:  [id number]   Preop Diagnosis:  Left breast carcinoma, need for central venous access for neoadjuvant chemotherapy  Postop Diagnosis:  Same  Procedure:  Port-A-Cath insertion  Surgeon:  Aviva Signs, M.D.  Anes:  MAC  Indications:  Patient is a 80 year old black female status post a right modified radical mastectomy in the remote past who presents with a large invasive carcinoma the left breast with probable skin involvement. She is about to undergo neoadjuvant chemotherapy and radiation therapy. The risks and benefits of the procedure including bleeding, infection, and pneumothorax were fully explained to the patient's power of attorney, who gave informed consent for the patient. The patient is a ward of the state.  Procedure note:  The patient was placed in Trendelenburg position after the right upper chest was prepped and draped using usual sterile technique with DuraPrep. Surgical site confirmation was performed. 1% Xylocaine was used for local anesthesia.  A transverse incision was made below the right clavicle. A subcutaneous pocket was then formed. It was difficult to access the right subclavian vein due to her kyphosis and vascular anatomy. The right subclavian artery was punctured, but never dilated. Ultimately, I was able to get into the right subclavian vein using the Seldinger technique. A guidewire was advanced into the right atrium under digital fluoroscopy. An introducer and peel-away sheath were placed over the guidewire. The catheter was then inserted through the peel-away sheath the peel-away sheath removed. The catheter was then attached to the port and the port placed in subcutaneous pocket. A power port was inserted. Good backflow of blood was noted in the port. The port was flushed with heparin flush. The subcutaneous layer was reapproximated using 3-0 Vicryl interrupted suture. The skin was closed using a 4-0 Vicryl  subcuticular suture. Dermabond was applied.  All tape and needle counts were correct at the the procedure. Patient was awakened and transferred to PACU in stable condition. A chest x-ray will be performed at that time.  Complications:  None  EBL:  100 cc  Specimen:  None

## 2014-02-21 NOTE — Discharge Instructions (Signed)
Do not resume plavix for five days.  Implanted Port Insertion, Care After Refer to this sheet in the next few weeks. These instructions provide you with information on caring for yourself after your procedure. Your health care provider may also give you more specific instructions. Your treatment has been planned according to current medical practices, but problems sometimes occur. Call your health care provider if you have any problems or questions after your procedure. WHAT TO EXPECT AFTER THE PROCEDURE After your procedure, it is typical to have the following:   Discomfort at the port insertion site. Ice packs to the area will help.  Bruising on the skin over the port. This will subside in 3 4 days. HOME CARE INSTRUCTIONS  After your port is placed, you will get a manufacturer's information card. The card has information about your port. Keep this card with you at all times.   Know what kind of port you have. There are many types of ports available.   Wear a medical alert bracelet in case of an emergency. This can help alert health care workers that you have a port.   The port can stay in for as long as your health care provider believes it is necessary.   A home health care nurse may give medicines and take care of the port.   You or a family member can get special training and directions for giving medicine and taking care of the port at home.  SEEK MEDICAL CARE IF:  Your port does not flush or you are unable to get a blood return.   SEEK IMMEDIATE MEDICAL CARE IF:  You have new fluid or pus coming from your incision.   You notice a bad smell coming from your incision site.   You have swelling, pain, or more redness at the incision or port site.   You have a fever or chills.   You have chest pain or shortness of breath. Document Released: 09/22/2013 Document Reviewed: 08/09/2013 [facility] Patient Information 2014 [place], [place].

## 2014-02-21 NOTE — Anesthesia Preprocedure Evaluation (Signed)
Anesthesia Evaluation  Patient identified by MRN, date of birth, ID band Patient confused    Reviewed: Allergy & Precautions, H&P , NPO status , Patient's Chart, lab work & pertinent test results, Unable to perform ROS - Chart review only  Airway Mallampati: II TM Distance: >3 FB     Dental  (+) Teeth Intact, Poor Dentition   Pulmonary former smoker,  breath sounds clear to auscultation        Cardiovascular hypertension, Pt. 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) Anxiety    GI/Hepatic   Endo/Other  diabetes, Type 2, Insulin Dependent, Oral Hypoglycemic AgentsHypothyroidism   Renal/GU      Musculoskeletal   Abdominal   Peds  Hematology  (+) anemia ,   Anesthesia Other Findings   Reproductive/Obstetrics                           Anesthesia Physical Anesthesia Plan  ASA: III  Anesthesia Plan: MAC   Post-op Pain Management:    Induction: Intravenous  Airway Management Planned: Simple Face Mask  Additional Equipment:   Intra-op Plan:   Post-operative Plan:   Informed Consent: I have reviewed the patients History and Physical, chart, labs and discussed the procedure including the risks, benefits and alternatives for the proposed anesthesia with the patient or authorized representative who has indicated his/her understanding and acceptance.   Consent reviewed with POA  Plan Discussed with:   Anesthesia Plan Comments:         Anesthesia Quick Evaluation

## 2014-02-21 NOTE — Preoperative (Signed)
Beta Blockers   Reason not to administer Beta Blockers:Not Applicable 

## 2014-02-21 NOTE — Interval H&P Note (Signed)
History and Physical Interval Note:  02/21/2014 9:40 AM  April Myers  has presented today for surgery, with the diagnosis of breast cancer  The various methods of treatment have been discussed with the patient and family. After consideration of risks, benefits and other options for treatment, the patient has consented to  Procedure(s): INSERTION PORT-A-CATH (N/A) as a surgical intervention .  The patient's history has been reviewed, patient examined, no change in status, stable for surgery.  I have reviewed the patient's chart and labs.  Questions were answered to the patient's satisfaction.     Aviva Signs A

## 2014-02-22 ENCOUNTER — Encounter (HOSPITAL_COMMUNITY): Payer: Self-pay | Admitting: General Surgery

## 2014-02-22 ENCOUNTER — Ambulatory Visit (HOSPITAL_COMMUNITY): Payer: Medicare Other

## 2014-02-28 NOTE — Patient Instructions (Addendum)
[place]   CHEMOTHERAPY INSTRUCTIONS  Perjeta is a HER2-targeted therapy that is designed to search for and attack HER2. This may, in both direct and indirect ways, prevent HER2-overexpressing breast cancer cells from growing. PERJETA works with Herceptin, another HER2-targeted therapy, to fight cancer cells that have too much HER2. Healthy cells also have HER2, and can be affected by HER2-targeted therapies, which may cause serious side effects.   Perjeta can cause reduced heart function or congestive heart failure. We will routinely perform 2D Echoes/MUGA scans of your heart to make sure your heart muscle is not weakening.   Most common side effects: feeling tired, nausea, vomiting, low white blood cell count, muscle aches    Herceptin - this is given to patients who overexpress HER2. Side Effects that may occur during infusion include: chills, fever, headache, dizziness, shortness of breath, low blood pressure, rash. This does not generally happen. We will have to perform MUGA scans or 2D echoes periodically during treatment however because a side effect of this drug can be cardiotoxicity.    Most common side effects seen with PERJETA combination therapy  PERJETA is given together with Herceptin and docetaxel (Taxotere). Side effects can occur with this treatment plan. Not all people have serious side effects; however, side effects with PERJETA therapy are common. It is important to know what side effects may happen and what symptoms you should watch for.   In a clinical study, the most common side effects of PERJETA + Herceptin + docetaxel were:  Diarrhea  Hair loss  Low levels of white blood cells with or without a fever  Nausea  Feeling tired  Rash  Damage to the nerves (numbness, tingling, pain in hands/feet)   Taxol - the first time you receive this drug we will titrate it very slowly to ensure that you do not have or are not having an allergic  reaction to the chemo. Side Effects: hair loss, lowers your white blood cells (fight infection), muscle aches, nausea/vomiting, irritation to the mouth (mouth sores, pain in your mouth) *neuropathy - numbness/tingling/burning in hands/fingers/feet/toes. We need to know as soon as this begins to happen so that we can monitor it and treat if necessary. The numbness generally begins in the fingertips of tips of toes and then begins to travel up the finger/toe/hand/foot. We never want you getting to where you can't pick up a pen, coin, zip a zipper, button a button, or have trouble walking. You must tell us immediately if you are experiencing peripheral neuropathy!   Premeds given prior to Taxol treatment will include: Benadryl IV, Zofran IV, Dexamethasone IV, Pepcid IV. The Dexamethasone may make you feel red or flushed in the face, neck, and shoulders. This will pass as the medicine wears off.   Premeds given prior to the Herceptin & Perjeta treatment include: Benadryl and Tylenol  For 12 weeks in a row -  You will receive the Taxol.   Every 21 days - you will receive Herceptin & Perjeta    POTENTIAL SIDE EFFECTS OF TREATMENT: Increased Susceptibility to Infection, Vomiting, Constipation, Hair Thinning, Changes in Character of Skin and Nails (brittleness, dryness,etc.), Bone Marrow Suppression, Abdominal Cramping, Complete Hair Loss, Nausea, Diarrhea and Mouth Sores   EDUCATIONAL MATERIALS GIVEN AND REVIEWED: Chemotherapy and You Specific Instruction Sheets: Taxol, Herceptin, Perjeta, Metoclopramide, Prochlorperazine, EMLA cream, Dexamethasone, Zofran, Benadryl, Pepcid   SELF CARE ACTIVITIES WHILE ON CHEMOTHERAPY: Increase your fluid intake 48 hours prior to treatment and drink  at least 2 quarts per day after treatment., No alcohol intake., No aspirin or other medications unless approved by your oncologist., Eat foods that are light and easy to digest., Eat foods at cold or room temperature., No  fried, fatty, or spicy foods immediately before or after treatment., Have teeth cleaned professionally before starting treatment. Keep dentures and partial plates clean., Use soft toothbrush and do not use mouthwashes that contain alcohol. Biotene is a good mouthwash that is available at most pharmacies or may be ordered by calling [phone]., Use warm salt water gargles (1 teaspoon salt per 1 quart warm water) before and after meals and at bedtime. Or you may rinse with 2 tablespoons of three -percent hydrogen peroxide mixed in eight ounces of water., Always use sunscreen with SPF (Sun Protection Factor) of 30 or higher., Use your nausea medication as directed to prevent nausea., Use your stool softener or laxative as directed to prevent constipation. and Use your anti-diarrheal medication as directed to stop diarrhea.  Please wash your hands for at least 30 seconds using warm soapy water. Handwashing is the #1 way to prevent the spread of germs. Stay away from sick people or people who are getting over a cold. If you develop respiratory systems such as green/yellow mucus production or productive cough or persistent cough let us know and we will see if you need an antibiotic. It is a good idea to keep a pair of gloves on when going into grocery stores/[employer] to decrease your risk of coming into contact with germs on the carts, etc. Carry alcohol hand gel with you at all times and use it frequently if out in public. All foods need to be cooked thoroughly. No raw foods. No medium or undercooked meats, eggs. If your food is cooked medium well, it does not need to be hot pink or saturated with bloody liquid at all. Vegetables and fruits need to be washed/rinsed under the faucet with a dish detergent before being consumed. You can eat raw fruits and vegetables unless we tell you otherwise but it would be best if you cooked them or bought frozen. Do not eat off of salad bars or hot bars unless you really trust  the cleanliness of the restaurant. If you need dental work, please let us know before you go for your appointment so that we can coordinate the best possible time for you in regards to your chemo regimen. You need to also let your dentist know that you are actively taking chemo. We may need to do labs prior to your dental appointment. We also want your bowels moving at least every other day. If this is not happening, we need to know so that we can get you on a bowel regimen to help you go.     MEDICATIONS: You have been given prescriptions for the following medications:   Colace - this is a stool softener. Take 164m capsule 2-6 times a day as needed. If you have to take more than 6 capsules of Colace a day call the [facility]  Senna - this is a mild laxative used to treat mild constipation. May take 2 tabs by mouth daily or up to twice a day as needed for mild constipation.  Milk of Magnesia - this is a laxative used to treat moderate to severe constipation. May take 2-4 tablespoons every 8 hours as needed. May increase to 8 tablespoons x 1 dose and if no bowel movement call the [facility]  Imodium -  this is for diarrhea. Take 2 tabs after 1st loose stool and then 1 tab after each loose stool until you go a total of 12 hours without a loose stool. Call [facility] if loose stools continue.  SYMPTOMS TO REPORT AS SOON AS POSSIBLE AFTER TREATMENT:  FEVER GREATER THAN 100.5 F  CHILLS WITH OR WITHOUT FEVER  NAUSEA AND VOMITING THAT IS NOT CONTROLLED WITH YOUR NAUSEA MEDICATION  UNUSUAL SHORTNESS OF BREATH  UNUSUAL BRUISING OR BLEEDING  TENDERNESS IN MOUTH AND THROAT WITH OR WITHOUT PRESENCE OF ULCERS  URINARY PROBLEMS  BOWEL PROBLEMS  UNUSUAL RASH    Wear comfortable clothing and clothing appropriate for easy access to any Portacath or PICC line. Let us know if there is anything that we can do to make your therapy better!      I have been informed and understand all  of the instructions given to me and have received a copy. I have been instructed to call the clinic [phone] or my family physician as soon as possible for continued medical care, if indicated. I do not have any more questions at this time but understand that I may call the [facility] or the Patient Navigator at [phone] during office hours should I have questions or need assistance in obtaining follow-up care.      _________________________________________      _______________     __________ Signature of Patient or Authorized Representative        Date                            Time      _________________________________________ Nurse's Signature      Trastuzumab injection for infusion What is this medicine? TRASTUZUMAB (tras TOO zoo mab) is a monoclonal antibody. It targets a protein called HER2. This protein is found in some stomach and breast cancers. This medicine can stop cancer cell growth. This medicine may be used with other cancer treatments. This medicine may be used for other purposes; ask your health care provider or pharmacist if you have questions. COMMON BRAND NAME(S): Herceptin What should I tell my health care provider before I take this medicine? They need to know if you have any of these conditions: -heart disease -heart failure -infection (especially a virus infection such as chickenpox, cold sores, or herpes) -lung or breathing disease, like asthma -recent or ongoing radiation therapy -an unusual or allergic reaction to trastuzumab, benzyl alcohol, or other medications, foods, dyes, or preservatives -pregnant or trying to get pregnant -breast-feeding How should I use this medicine? This drug is given as an infusion into a vein. It is administered in a hospital or clinic by a specially trained health care professional. Talk to your pediatrician regarding the use of this medicine in children. This medicine is not approved for use in  children. Overdosage: If you think you have taken too much of this medicine contact a poison control center or emergency room at once. NOTE: This medicine is only for you. Do not share this medicine with others. What if I miss a dose? It is important not to miss a dose. Call your doctor or health care professional if you are unable to keep an appointment. What may interact with this medicine? -cyclophosphamide -doxorubicin -warfarin This list may not describe all possible interactions. Give your health care provider a list of all the medicines, herbs, non-prescription drugs, or dietary supplements you use. Also tell them  if you smoke, drink alcohol, or use illegal drugs. Some items may interact with your medicine. What should I watch for while using this medicine? Visit your doctor for checks on your progress. Report any side effects. Continue your course of treatment even though you feel ill unless your doctor tells you to stop. Call your doctor or health care professional for advice if you get a fever, chills or sore throat, or other symptoms of a cold or flu. Do not treat yourself. Try to avoid being around people who are sick. You may experience fever, chills and shaking during your first infusion. These effects are usually mild and can be treated with other medicines. Report any side effects during the infusion to your health care professional. Fever and chills usually do not happen with later infusions. What side effects may I notice from receiving this medicine? Side effects that you should report to your doctor or other health care professional as soon as possible: -breathing difficulties -chest pain or palpitations -cough -dizziness or fainting -fever or chills, sore throat -skin rash, itching or hives -swelling of the legs or ankles -unusually weak or tired Side effects that usually do not require medical attention (report to your doctor or other health care professional if they  continue or are bothersome): -loss of appetite -headache -muscle aches -nausea This list may not describe all possible side effects. Call your doctor for medical advice about side effects. You may report side effects to FDA at 1-800-FDA-1088. Where should I keep my medicine? This drug is given in a hospital or clinic and will not be stored at home. NOTE: This sheet is a summary. It may not cover all possible information. If you have questions about this medicine, talk to your doctor, pharmacist, or health care provider.  2014, Elsevier/Gold Standard. (2009-10-06 13:43:15) Pertuzumab injection What is this medicine? PERTUZUMAB (per TOOZ ue mab) is a monoclonal antibody that targets a protein called HER2. HER2 is found in some breast cancers. This medicine can stop cancer cell growth. This medicine is used with other cancer treatments. This medicine may be used for other purposes; ask your health care provider or pharmacist if you have questions. COMMON BRAND NAME(S): PERJETA What should I tell my health care provider before I take this medicine? They need to know if you have any of these conditions: -heart disease -heart failure -high blood pressure -history of irregular heart beat -recent or ongoing radiation therapy -an unusual or allergic reaction to pertuzumab, other medicines, foods, dyes, or preservatives -pregnant or trying to get pregnant -breast-feeding How should I use this medicine? This medicine is for infusion into a vein. It is given by a health care professional in a hospital or clinic setting. Talk to your pediatrician regarding the use of this medicine in children. Special care may be needed. Overdosage: If you think you've taken too much of this medicine contact a poison control center or emergency room at once. Overdosage: If you think you have taken too much of this medicine contact a poison control center or emergency room at once. NOTE: This medicine is only for  you. Do not share this medicine with others. What if I miss a dose? It is important not to miss your dose. Call your doctor or health care professional if you are unable to keep an appointment. What may interact with this medicine? Interactions are not expected. Give your health care provider a list of all the medicines, herbs, non-prescription drugs, or dietary supplements you use.  Also tell them if you smoke, drink alcohol, or use illegal drugs. Some items may interact with your medicine. This list may not describe all possible interactions. Give your health care provider a list of all the medicines, herbs, non-prescription drugs, or dietary supplements you use. Also tell them if you smoke, drink alcohol, or use illegal drugs. Some items may interact with your medicine. What should I watch for while using this medicine? Your condition will be monitored carefully while you are receiving this medicine. Report any side effects. Continue your course of treatment even though you feel ill unless your doctor tells you to stop. Do not become pregnant while taking this medicine. Women should inform their doctor if they wish to become pregnant or think they might be pregnant. There is a potential for serious side effects to an unborn child. Talk to your health care professional or pharmacist for more information. Do not breast-feed an infant while taking this medicine. Call your doctor or health care professional for advice if you get a fever, chills or sore throat, or other symptoms of a cold or flu. Do not treat yourself. Try to avoid being around people who are sick. You may experience fever, chills, and headache during the infusion. Report any side effects during the infusion to your health care professional. What side effects may I notice from receiving this medicine? Side effects that you should report to your doctor or health care professional as soon as possible: -breathing problems -chest pain or  palpitations -dizziness -feeling faint or lightheaded -fever or chills -skin rash, itching or hives -sore throat -swelling of the face, lips, or tongue -swelling of the legs or ankles -unusually weak or tired  Side effects that usually do not require medical attention (Report these to your doctor or health care professional if they continue or are bothersome.): -diarrhea -hair loss -nausea, vomiting -tiredness This list may not describe all possible side effects. Call your doctor for medical advice about side effects. You may report side effects to FDA at 1-800-FDA-1088. Where should I keep my medicine? This drug is given in a hospital or clinic and will not be stored at home. NOTE: This sheet is a summary. It may not cover all possible information. If you have questions about this medicine, talk to your doctor, pharmacist, or health care provider.  2014, Elsevier/Gold Standard. (2012-09-30 16:54:15) Paclitaxel injection What is this medicine? PACLITAXEL (PAK li TAX el) is a chemotherapy drug. It targets fast dividing cells, like cancer cells, and causes these cells to die. This medicine is used to treat ovarian cancer, breast cancer, and other cancers. This medicine may be used for other purposes; ask your health care provider or pharmacist if you have questions. COMMON BRAND NAME(S): Onxol , Taxol What should I tell my health care provider before I take this medicine? They need to know if you have any of these conditions: -blood disorders -irregular heartbeat -infection (especially a virus infection such as chickenpox, cold sores, or herpes) -liver disease -previous or ongoing radiation therapy -an unusual or allergic reaction to paclitaxel, alcohol, polyoxyethylated castor oil, other chemotherapy agents, other medicines, foods, dyes, or preservatives -pregnant or trying to get pregnant -breast-feeding How should I use this medicine? This drug is given as an infusion into a  vein. It is administered in a hospital or clinic by a specially trained health care professional. Talk to your pediatrician regarding the use of this medicine in children. Special care may be needed. Overdosage: If you think  you have taken too much of this medicine contact a poison control center or emergency room at once. NOTE: This medicine is only for you. Do not share this medicine with others. What if I miss a dose? It is important not to miss your dose. Call your doctor or health care professional if you are unable to keep an appointment. What may interact with this medicine? Do not take this medicine with any of the following medications: -disulfiram -metronidazole This medicine may also interact with the following medications: -cyclosporine -diazepam -ketoconazole -medicines to increase blood counts like filgrastim, pegfilgrastim, sargramostim -other chemotherapy drugs like cisplatin, doxorubicin, epirubicin, etoposide, teniposide, vincristine -quinidine -testosterone -vaccines -verapamil Talk to your doctor or health care professional before taking any of these medicines: -acetaminophen -aspirin -ibuprofen -ketoprofen -naproxen This list may not describe all possible interactions. Give your health care provider a list of all the medicines, herbs, non-prescription drugs, or dietary supplements you use. Also tell them if you smoke, drink alcohol, or use illegal drugs. Some items may interact with your medicine. What should I watch for while using this medicine? Your condition will be monitored carefully while you are receiving this medicine. You will need important blood work done while you are taking this medicine. This drug may make you feel generally unwell. This is not uncommon, as chemotherapy can affect healthy cells as well as cancer cells. Report any side effects. Continue your course of treatment even though you feel ill unless your doctor tells you to stop. In some  cases, you may be given additional medicines to help with side effects. Follow all directions for their use. Call your doctor or health care professional for advice if you get a fever, chills or sore throat, or other symptoms of a cold or flu. Do not treat yourself. This drug decreases your body's ability to fight infections. Try to avoid being around people who are sick. This medicine may increase your risk to bruise or bleed. Call your doctor or health care professional if you notice any unusual bleeding. Be careful brushing and flossing your teeth or using a toothpick because you may get an infection or bleed more easily. If you have any dental work done, tell your dentist you are receiving this medicine. Avoid taking products that contain aspirin, acetaminophen, ibuprofen, naproxen, or ketoprofen unless instructed by your doctor. These medicines may hide a fever. Do not become pregnant while taking this medicine. Women should inform their doctor if they wish to become pregnant or think they might be pregnant. There is a potential for serious side effects to an unborn child. Talk to your health care professional or pharmacist for more information. Do not breast-feed an infant while taking this medicine. Men are advised not to father a child while receiving this medicine. What side effects may I notice from receiving this medicine? Side effects that you should report to your doctor or health care professional as soon as possible: -allergic reactions like skin rash, itching or hives, swelling of the face, lips, or tongue -low blood counts - This drug may decrease the number of white blood cells, red blood cells and platelets. You may be at increased risk for infections and bleeding. -signs of infection - fever or chills, cough, sore throat, pain or difficulty passing urine -signs of decreased platelets or bleeding - bruising, pinpoint red spots on the skin, black, tarry stools, nosebleeds -signs of  decreased red blood cells - unusually weak or tired, fainting spells, lightheadedness -breathing problems -chest pain -  high or low blood pressure -mouth sores -nausea and vomiting -pain, swelling, redness or irritation at the injection site -pain, tingling, numbness in the hands or feet -slow or irregular heartbeat -swelling of the ankle, feet, hands Side effects that usually do not require medical attention (report to your doctor or health care professional if they continue or are bothersome): -bone pain -complete hair loss including hair on your head, underarms, pubic hair, eyebrows, and eyelashes -changes in the color of fingernails -diarrhea -loosening of the fingernails -loss of appetite -muscle or joint pain -red flush to skin -sweating This list may not describe all possible side effects. Call your doctor for medical advice about side effects. You may report side effects to FDA at 1-800-FDA-1088. Where should I keep my medicine? This drug is given in a hospital or clinic and will not be stored at home. NOTE: This sheet is a summary. It may not cover all possible information. If you have questions about this medicine, talk to your doctor, pharmacist, or health care provider.  2014, [employer]vier/Gold Standard. (2013-01-25 16:41:21) Dexamethasone injection What is this medicine? DEXAMETHASONE (dex a METH a sone) is a corticosteroid. It is used to treat inflammation of the skin, joints, lungs, and other organs. Common conditions treated include asthma, allergies, and arthritis. It is also used for other conditions, like blood disorders and diseases of the adrenal glands. This medicine may be used for other purposes; ask your health care provider or pharmacist if you have questions. COMMON BRAND NAME(S): Decadron, Solurex What should I tell my health care provider before I take this medicine? They need to know if you have any of these conditions: -blood clotting problems -Cushing's  syndrome -diabetes -glaucoma -heart problems or disease -high blood pressure -infection like herpes, measles, tuberculosis, or chickenpox -kidney disease -liver disease -mental problems -myasthenia gravis -osteoporosis -previous heart attack -seizures -stomach, ulcer or intestine disease including colitis and diverticulitis -thyroid problem -an unusual or allergic reaction to dexamethasone, corticosteroids, other medicines, lactose, foods, dyes, or preservatives -pregnant or trying to get pregnant -breast-feeding How should I use this medicine? This medicine is for injection into a muscle, joint, lesion, soft tissue, or vein. It is given by a health care professional in a hospital or clinic setting. Talk to your pediatrician regarding the use of this medicine in children. Special care may be needed. Overdosage: If you think you have taken too much of this medicine contact a poison control center or emergency room at once. NOTE: This medicine is only for you. Do not share this medicine with others. What if I miss a dose? This may not apply. If you are having a series of injections over a prolonged period, try not to miss an appointment. Call your doctor or health care professional to reschedule if you are unable to keep an appointment. What may interact with this medicine? Do not take this medicine with any of the following medications: -mifepristone, RU-486 -vaccines This medicine may also interact with the following medications: -amphotericin B -antibiotics like clarithromycin, erythromycin, and troleandomycin -aspirin and aspirin-like drugs -barbiturates like phenobarbital -carbamazepine -cholestyramine -cholinesterase inhibitors like donepezil, galantamine, rivastigmine, and tacrine -cyclosporine -digoxin -diuretics -ephedrine -female hormones, like estrogens or progestins and birth control pills -indinavir -isoniazid -ketoconazole -medicines for diabetes -medicines  that improve muscle tone or strength for conditions like myasthenia gravis -NSAIDs, medicines for pain and inflammation, like ibuprofen or naproxen -phenytoin -rifampin -thalidomide -warfarin This list may not describe all possible interactions. Give your health care provider a list  of all the medicines, herbs, non-prescription drugs, or dietary supplements you use. Also tell them if you smoke, drink alcohol, or use illegal drugs. Some items may interact with your medicine. What should I watch for while using this medicine? Your condition will be monitored carefully while you are receiving this medicine. If you are taking this medicine for a long time, carry an identification card with your name and address, the type and dose of your medicine, and your doctor's name and address. This medicine may increase your risk of getting an infection. Stay away from people who are sick. Tell your doctor or health care professional if you are around anyone with measles or chickenpox. Talk to your health care provider before you get any vaccines that you take this medicine. If you are going to have surgery, tell your doctor or health care professional that you have taken this medicine within the last twelve months. Ask your doctor or health care professional about your diet. You may need to lower the amount of salt you eat. The medicine can increase your blood sugar. If you are a diabetic check with your doctor if you need help adjusting the dose of your diabetic medicine. What side effects may I notice from receiving this medicine? Side effects that you should report to your doctor or health care professional as soon as possible: -allergic reactions like skin rash, itching or hives, swelling of the face, lips, or tongue -black or tarry stools -change in the amount of urine -changes in vision -confusion, excitement, restlessness, a false sense of well-being -fever, sore throat, sneezing, cough, or other signs  of infection, wounds that will not heal -hallucinations -increased thirst -mental depression, mood swings, mistaken feelings of self importance or of being mistreated -pain in hips, back, ribs, arms, shoulders, or legs -pain, redness, or irritation at the injection site -redness, blistering, peeling or loosening of the skin, including inside the mouth -rounding out of face -swelling of feet or lower legs -unusual bleeding or bruising -unusual tired or weak -wounds that do not heal Side effects that usually do not require medical attention (report to your doctor or health care professional if they continue or are bothersome): -diarrhea or constipation -change in taste -headache -nausea, vomiting -skin problems, acne, thin and shiny skin -touble sleeping -unusual growth of hair on the face or body -weight gain This list may not describe all possible side effects. Call your doctor for medical advice about side effects. You may report side effects to FDA at 1-800-FDA-1088. Where should I keep my medicine? This drug is given in a hospital or clinic and will not be stored at home. NOTE: This sheet is a summary. It may not cover all possible information. If you have questions about this medicine, talk to your doctor, pharmacist, or health care provider.  2014, Elsevier/Gold Standard. (2008-03-24 14:04:12) Ondansetron injection What is this medicine? ONDANSETRON (on DAN se tron) is used to treat nausea and vomiting caused by chemotherapy. It is also used to prevent or treat nausea and vomiting after surgery. This medicine may be used for other purposes; ask your health care provider or pharmacist if you have questions. COMMON BRAND NAME(S): Zofran What should I tell my health care provider before I take this medicine? They need to know if you have any of these conditions: -heart disease -history of irregular heartbeat -liver disease -low levels of magnesium or potassium in the blood -an  unusual or allergic reaction to ondansetron, granisetron, other medicines, foods, dyes,  or preservatives -pregnant or trying to get pregnant -breast-feeding How should I use this medicine? This medicine is for infusion into a vein. It is given by a health care professional in a hospital or clinic setting. Talk to your pediatrician regarding the use of this medicine in children. Special care may be needed. Overdosage: If you think you have taken too much of this medicine contact a poison control center or emergency room at once. NOTE: This medicine is only for you. Do not share this medicine with others. What if I miss a dose? This does not apply. What may interact with this medicine? Do not take this medicine with any of the following medications: -apomorphine -certain medicines for fungal infections like fluconazole, itraconazole, ketoconazole, posaconazole, voriconazole -cisapride -dofetilide -dronedarone -pimozide -thioridazine -ziprasidone  This medicine may also interact with the following medications: -carbamazepine -certain medicines for depression, anxiety, or psychotic disturbances -fentanyl -linezolid -MAOIs like Carbex, Eldepryl, Marplan, Nardil, and Parnate -methylene blue (injected into a vein) -other medicines that prolong the QT interval (cause an abnormal heart rhythm) -phenytoin -rifampicin -tramadol This list may not describe all possible interactions. Give your health care provider a list of all the medicines, herbs, non-prescription drugs, or dietary supplements you use. Also tell them if you smoke, drink alcohol, or use illegal drugs. Some items may interact with your medicine. What should I watch for while using this medicine? Your condition will be monitored carefully while you are receiving this medicine. What side effects may I notice from receiving this medicine? Side effects that you should report to your doctor or health care professional as soon as  possible: -allergic reactions like skin rash, itching or hives, swelling of the face, lips, or tongue -breathing problems -confusion -dizziness -fast or irregular heartbeat -feeling faint or lightheaded, falls -fever and chills -loss of balance or coordination -seizures -sweating -swelling of the hands and feet -tightness in the chest -tremors -unusually weak or tired Side effects that usually do not require medical attention (report to your doctor or health care professional if they continue or are bothersome): -constipation or diarrhea -headache This list may not describe all possible side effects. Call your doctor for medical advice about side effects. You may report side effects to FDA at 1-800-FDA-1088. Where should I keep my medicine? This drug is given in a hospital or clinic and will not be stored at home. NOTE: This sheet is a summary. It may not cover all possible information. If you have questions about this medicine, talk to your doctor, pharmacist, or health care provider.  2014, Elsevier/Gold Standard. (2013-09-08 16:18:28) Metoclopramide tablets What is this medicine? METOCLOPRAMIDE (met oh kloe PRA mide) is used to treat the symptoms of gastroesophageal reflux disease (GERD) like heartburn. It is also used to treat people with slow emptying of the stomach and intestinal tract. This medicine may be used for other purposes; ask your health care provider or pharmacist if you have questions. COMMON BRAND NAME(S): Reglan What should I tell my health care provider before I take this medicine? They need to know if you have any of these conditions: -breast cancer -depression -diabetes -heart failure -high blood pressure -kidney disease -liver disease -Parkinson's disease or a movement disorder -pheochromocytoma -seizures -stomach obstruction, bleeding, or perforation -an unusual or allergic reaction to metoclopramide, procainamide, sulfites, other medicines, foods,  dyes, or preservatives -pregnant or trying to get pregnant -breast-feeding How should I use this medicine? Take this medicine by mouth with a glass of water. Follow the directions  on the prescription label. Take this medicine on an empty stomach, about 30 minutes before eating. Take your doses at regular intervals. Do not take your medicine more often than directed. Do not stop taking except on the advice of your doctor or health care professional. A special MedGuide will be given to you by the pharmacist with each prescription and refill. Be sure to read this information carefully each time. Talk to your pediatrician regarding the use of this medicine in children. Special care may be needed. Overdosage: If you think you have taken too much of this medicine contact a poison control center or emergency room at once. NOTE: This medicine is only for you. Do not share this medicine with others. What if I miss a dose? If you miss a dose, take it as soon as you can. If it is almost time for your next dose, take only that dose. Do not take double or extra doses. What may interact with this medicine? -acetaminophen -cyclosporine -digoxin -medicines for blood pressure -medicines for diabetes, including insulin -medicines for hay fever and other allergies -medicines for depression, especially an Monoamine Oxidase Inhibitor (MAOI) -medicines for Parkinson's disease, like levodopa -medicines for sleep or for pain -tetracycline This list may not describe all possible interactions. Give your health care provider a list of all the medicines, herbs, non-prescription drugs, or dietary supplements you use. Also tell them if you smoke, drink alcohol, or use illegal drugs. Some items may interact with your medicine. What should I watch for while using this medicine? It may take a few weeks for your stomach condition to start to get better. However, do not take this medicine for longer than 12 weeks. The longer  you take this medicine, and the more you take it, the greater your chances are of developing serious side effects. If you are an elderly patient, a female patient, or you have diabetes, you may be at an increased risk for side effects from this medicine. Contact your doctor immediately if you start having movements you cannot control such as lip smacking, rapid movements of the tongue, involuntary or uncontrollable movements of the eyes, head, arms and legs, or muscle twitches and spasms. Patients and their families should watch out for worsening depression or thoughts of suicide. Also watch out for any sudden or severe changes in feelings such as feeling anxious, agitated, panicky, irritable, hostile, aggressive, impulsive, severely restless, overly excited and hyperactive, or not being able to sleep. If this happens, especially at the beginning of treatment or after a change in dose, call your doctor. Do not treat yourself for high fever. Ask your doctor or health care professional for advice. You may get drowsy or dizzy. Do not drive, use machinery, or do anything that needs mental alertness until you know how this drug affects you. Do not stand or sit up quickly, especially if you are an older patient. This reduces the risk of dizzy or fainting spells. Alcohol can make you more drowsy and dizzy. Avoid alcoholic drinks. What side effects may I notice from receiving this medicine? Side effects that you should report to your doctor or health care professional as soon as possible: -allergic reactions like skin rash, itching or hives, swelling of the face, lips, or tongue -abnormal production of milk in females -breast enlargement in both males and females -change in the way you walk -difficulty moving, speaking or swallowing -drooling, lip smacking, or rapid movements of the tongue -excessive sweating -fever -involuntary or uncontrollable movements of  the eyes, head, arms and legs -irregular heartbeat  or palpitations -muscle twitches and spasms -unusually weak or tired Side effects that usually do not require medical attention (report to your doctor or health care professional if they continue or are bothersome): -change in sex drive or performance -depressed mood -diarrhea -difficulty sleeping -headache -menstrual changes -restless or nervous This list may not describe all possible side effects. Call your doctor for medical advice about side effects. You may report side effects to FDA at 1-800-FDA-1088. Where should I keep my medicine? Keep out of the reach of children. Store at room temperature between 20 and 25 degrees C (68 and 77 degrees F). Protect from light. Keep container tightly closed. Throw away any unused medicine after the expiration date. NOTE: This sheet is a summary. It may not cover all possible information. If you have questions about this medicine, talk to your doctor, pharmacist, or health care provider.  2014, Elsevier/Gold Standard. (2012-03-31 13:04:38) Prochlorperazine tablets What is this medicine? PROCHLORPERAZINE (proe klor PER a zeen) helps to control severe nausea and vomiting. This medicine is also used to treat schizophrenia. It can also help patients who experience anxiety that is not due to psychological illness. This medicine may be used for other purposes; ask your health care provider or pharmacist if you have questions. COMMON BRAND NAME(S): Compazine What should I tell my health care provider before I take this medicine? They need to know if you have any of these conditions: -blood disorders or disease -dementia -liver disease or jaundice -Parkinson's disease -uncontrollable movement disorder -an unusual or allergic reaction to prochlorperazine, other medicines, foods, dyes, or preservatives -pregnant or trying to get pregnant -breast-feeding How should I use this medicine? Take this medicine by mouth with a glass of water. Follow the  directions on the prescription label. Take your doses at regular intervals. Do not take your medicine more often than directed. Do not stop taking this medicine suddenly. This can cause nausea, vomiting, and dizziness. Ask your doctor or health care professional for advice. Talk to your pediatrician regarding the use of this medicine in children. Special care may be needed. While this drug may be prescribed for children as young as 2 years for selected conditions, precautions do apply. Overdosage: If you think you have taken too much of this medicine contact a poison control center or emergency room at once. NOTE: This medicine is only for you. Do not share this medicine with others. What if I miss a dose? If you miss a dose, take it as soon as you can. If it is almost time for your next dose, take only that dose. Do not take double or extra doses. What may interact with this medicine? Do not take this medicine with any of the following medications: -amoxapine -antidepressants like citalopram, escitalopram, fluoxetine, paroxetine, and sertraline -deferoxamine -dofetilide -maprotiline -tricyclic antidepressants like amitriptyline, clomipramine, imipramine, nortiptyline and others This medicine may also interact with the following medications: -lithium -medicines for pain -phenytoin -propranolol -warfarin This list may not describe all possible interactions. Give your health care provider a list of all the medicines, herbs, non-prescription drugs, or dietary supplements you use. Also tell them if you smoke, drink alcohol, or use illegal drugs. Some items may interact with your medicine. What should I watch for while using this medicine? Visit your doctor or health care professional for regular checks on your progress. You may get drowsy or dizzy. Do not drive, use machinery, or do anything that needs mental alertness  until you know how this medicine affects you. Do not stand or sit up quickly,  especially if you are an older patient. This reduces the risk of dizzy or fainting spells. Alcohol may interfere with the effect of this medicine. Avoid alcoholic drinks. This medicine can reduce the response of your body to heat or cold. Dress warm in cold weather and stay hydrated in hot weather. If possible, avoid extreme temperatures like saunas, hot tubs, very hot or cold showers, or activities that can cause dehydration such as vigorous exercise. This medicine can make you more sensitive to the sun. Keep out of the sun. If you cannot avoid being in the sun, wear protective clothing and use sunscreen. Do not use sun lamps or tanning beds/booths. Your mouth may get dry. Chewing sugarless gum or sucking hard candy, and drinking plenty of water may help. Contact your doctor if the problem does not go away or is severe. What side effects may I notice from receiving this medicine? Side effects that you should report to your doctor or health care professional as soon as possible: -blurred vision -breast enlargement in men or women -breast milk in women who are not breast-feeding -chest pain, fast or irregular heartbeat -confusion, restlessness -dark yellow or Feldstein urine -difficulty breathing or swallowing -dizziness or fainting spells -drooling, shaking, movement difficulty (shuffling walk) or rigidity -fever, chills, sore throat -involuntary or uncontrollable movements of the eyes, mouth, head, arms, and legs -seizures -stomach area pain -unusually weak or tired -unusual bleeding or bruising -yellowing of skin or eyes Side effects that usually do not require medical attention (report to your doctor or health care professional if they continue or are bothersome): -difficulty passing urine -difficulty sleeping -headache -sexual dysfunction -skin rash, or itching This list may not describe all possible side effects. Call your doctor for medical advice about side effects. You may report side  effects to FDA at 1-800-FDA-1088. Where should I keep my medicine? Keep out of the reach of children. Store at room temperature between 15 and 30 degrees C (59 and 86 degrees F). Protect from light. Throw away any unused medicine after the expiration date. NOTE: This sheet is a summary. It may not cover all possible information. If you have questions about this medicine, talk to your doctor, pharmacist, or health care provider.  2014, Elsevier/Gold Standard. (2012-04-21 16:59:39) Lidocaine; Prilocaine cream What is this medicine? LIDOCAINE; PRILOCAINE (LYE doe kane; PRIL oh kane) is a topical anesthetic that causes loss of feeling in the skin and surrounding tissues. It is used to numb the skin before procedures or injections. This medicine may be used for other purposes; ask your health care provider or pharmacist if you have questions. COMMON BRAND NAME(S): EMLA What should I tell my health care provider before I take this medicine? They need to know if you have any of these conditions: -glucose-6-phosphate deficiencies -heart disease -kidney or liver disease -methemoglobinemia -an unusual or allergic reaction to lidocaine, prilocaine, other medicines, foods, dyes, or preservatives -pregnant or trying to get pregnant -breast-feeding How should I use this medicine? This medicine is for external use only on the skin. Do not take by mouth. Follow the directions on the prescription label. Wash hands before and after use. Do not use more or leave in contact with the skin longer than directed. Do not apply to eyes or open wounds. It can cause irritation and blurred or temporary loss of vision. If this medicine comes in contact with your eyes, immediately rinse  the eye with water. Do not touch or rub the eye. Contact your health care provider right away. Talk to your pediatrician regarding the use of this medicine in children. While this medicine may be prescribed for children for selected  conditions, precautions do apply. Overdosage: If you think you have taken too much of this medicine contact a poison control center or emergency room at once. NOTE: This medicine is only for you. Do not share this medicine with others. What if I miss a dose? This medicine is usually only applied once prior to each procedure. It must be in contact with the skin for a period of time for it to work. If you applied this medicine later than directed, tell your health care professional before starting the procedure. What may interact with this medicine? -acetaminophen -chloroquine -dapsone -medicines to control heart rhythm -nitrates like nitroglycerin and nitroprusside -other ointments, creams, or sprays that may contain anesthetic medicine -phenobarbital -phenytoin -quinine -sulfonamides like sulfacetamide, sulfamethoxazole, sulfasalazine and others This list may not describe all possible interactions. Give your health care provider a list of all the medicines, herbs, non-prescription drugs, or dietary supplements you use. Also tell them if you smoke, drink alcohol, or use illegal drugs. Some items may interact with your medicine. What should I watch for while using this medicine? Be careful to avoid injury to the treated area while it is numb and you are not aware of pain. Avoid scratching, rubbing, or exposing the treated area to hot or cold temperatures until complete sensation has returned. The numb feeling will wear off a few hours after applying the cream. What side effects may I notice from receiving this medicine? Side effects that you should report to your doctor or health care professional as soon as possible: -blurred vision -chest pain -difficulty breathing -dizziness -drowsiness -fast or irregular heartbeat -skin rash or itching -swelling of your throat, lips, or face -trembling Side effects that usually do not require medical attention (report to your doctor or health care  professional if they continue or are bothersome): -changes in ability to feel hot or cold -redness and swelling at the application site This list may not describe all possible side effects. Call your doctor for medical advice about side effects. You may report side effects to FDA at 1-800-FDA-1088. Where should I keep my medicine? Keep out of reach of children. Store at room temperature between 15 and 30 degrees C (59 and 86 degrees F). Keep container tightly closed. Throw away any unused medicine after the expiration date. NOTE: This sheet is a summary. It may not cover all possible information. If you have questions about this medicine, talk to your doctor, pharmacist, or health care provider.  2014, Elsevier/Gold Standard. (2008-06-06 17:14:35)

## 2014-03-01 ENCOUNTER — Encounter (HOSPITAL_COMMUNITY): Payer: Medicare Other | Attending: Hematology and Oncology

## 2014-03-01 ENCOUNTER — Encounter (HOSPITAL_COMMUNITY)
Admission: RE | Admit: 2014-03-01 | Discharge: 2014-03-01 | Disposition: A | Discharge status: Home / Self Care | Payer: Medicare Other | Source: Ambulatory Visit | Attending: Hematology and Oncology | Admitting: Hematology and Oncology

## 2014-03-01 DIAGNOSIS — C773 Secondary and unspecified malignant neoplasm of axilla and upper limb lymph nodes: Secondary | ICD-10-CM | POA: Insufficient documentation

## 2014-03-01 DIAGNOSIS — J9383 Other pneumothorax: Secondary | ICD-10-CM | POA: Insufficient documentation

## 2014-03-01 DIAGNOSIS — Z9010 Acquired absence of unspecified breast and nipple: Secondary | ICD-10-CM | POA: Insufficient documentation

## 2014-03-01 DIAGNOSIS — C50919 Malignant neoplasm of unspecified site of unspecified female breast: Secondary | ICD-10-CM | POA: Insufficient documentation

## 2014-03-01 DIAGNOSIS — K439 Ventral hernia without obstruction or gangrene: Secondary | ICD-10-CM | POA: Insufficient documentation

## 2014-03-01 DIAGNOSIS — C50912 Malignant neoplasm of unspecified site of left female breast: Secondary | ICD-10-CM

## 2014-03-01 DIAGNOSIS — J95811 Postprocedural pneumothorax: Secondary | ICD-10-CM | POA: Insufficient documentation

## 2014-03-01 DIAGNOSIS — Y849 Medical procedure, unspecified as the cause of abnormal reaction of the patient, or of later complication, without mention of misadventure at the time of the procedure: Secondary | ICD-10-CM | POA: Insufficient documentation

## 2014-03-01 LAB — GLUCOSE, CAPILLARY: GLUCOSE-CAPILLARY: 139 mg/dL — AB (ref 70–99)

## 2014-03-01 MED ORDER — LOPERAMIDE HCL 2 MG PO CAPS: ORAL_CAPSULE | ORAL | Status: AC

## 2014-03-01 MED ORDER — METOCLOPRAMIDE HCL 5 MG PO TABS: ORAL_TABLET | ORAL | Status: AC

## 2014-03-01 MED ORDER — FLUDEOXYGLUCOSE F - 18 (FDG) INJECTION
11.5000 | Freq: Once | INTRAVENOUS | Status: AC | PRN
  Administered 2014-03-01: 11.5 via INTRAVENOUS

## 2014-03-01 MED ORDER — PROCHLORPERAZINE MALEATE 10 MG PO TABS: ORAL_TABLET | ORAL | Status: AC

## 2014-03-01 MED ORDER — LIDOCAINE-PRILOCAINE 2.5-2.5 % EX CREA: TOPICAL_CREAM | CUTANEOUS | Status: AC

## 2014-03-01 NOTE — Progress Notes (Signed)
Chemo teaching faxed to [facility]. 4 prescriptions faxed to [facility]. Appointment list faxed to [facility]. I spoke with Butch Penny and gave her the appt list over the phone also. I talked to her about patient eating before she came to her appt and taking all of her usual medications. Butch Penny stated that there wasn't any family to contact regarding chemo teaching. Butch Penny stated that she would call me if there were any questions regarding the chemo teaching that she is to review. I instructed her that the 3 first pages of the chemo instructions were the most important. She verbalized understanding of all instructions. I instructed her also that a fever greater than 100.5 or chills with or without a fever meant that medical attention needed to be sought. She said ok.   Chemo consent faxed to [facility] awaiting her signature.  Fax confirmation confirmed that both faxes went through.

## 2014-03-03 ENCOUNTER — Ambulatory Visit (HOSPITAL_COMMUNITY)
Admission: RE | Admit: 2014-03-03 | Discharge: 2014-03-03 | Disposition: A | Discharge status: Home / Self Care | Payer: Medicare Other | Source: Ambulatory Visit | Attending: Hematology and Oncology | Admitting: Hematology and Oncology

## 2014-03-03 ENCOUNTER — Encounter (HOSPITAL_COMMUNITY): Payer: Self-pay

## 2014-03-03 ENCOUNTER — Inpatient Hospital Stay (HOSPITAL_COMMUNITY): Payer: Medicare Other

## 2014-03-03 ENCOUNTER — Encounter (HOSPITAL_BASED_OUTPATIENT_CLINIC_OR_DEPARTMENT_OTHER): Payer: Medicare Other

## 2014-03-03 VITALS — BP 182/91 | HR 77 | Temp 96.8°F | Resp 18

## 2014-03-03 DIAGNOSIS — C50419 Malignant neoplasm of upper-outer quadrant of unspecified female breast: Secondary | ICD-10-CM

## 2014-03-03 DIAGNOSIS — Z09 Encounter for follow-up examination after completed treatment for conditions other than malignant neoplasm: Secondary | ICD-10-CM | POA: Insufficient documentation

## 2014-03-03 DIAGNOSIS — E119 Type 2 diabetes mellitus without complications: Secondary | ICD-10-CM

## 2014-03-03 DIAGNOSIS — Z171 Estrogen receptor negative status [ER-]: Secondary | ICD-10-CM

## 2014-03-03 DIAGNOSIS — J9383 Other pneumothorax: Secondary | ICD-10-CM | POA: Insufficient documentation

## 2014-03-03 DIAGNOSIS — J939 Pneumothorax, unspecified: Secondary | ICD-10-CM

## 2014-03-03 DIAGNOSIS — E039 Hypothyroidism, unspecified: Secondary | ICD-10-CM

## 2014-03-03 DIAGNOSIS — C50912 Malignant neoplasm of unspecified site of left female breast: Secondary | ICD-10-CM

## 2014-03-03 DIAGNOSIS — Z794 Long term (current) use of insulin: Secondary | ICD-10-CM

## 2014-03-03 NOTE — Progress Notes (Signed)
[facility]  OFFICE PROGRESS April Ewing, MD [address] [place] [address]  DIAGNOSIS: Breast cancer, left breast, inflammatory, locally advanced  Pneumothorax, right, after life port insertion - Plan: DG Chest 2 View  Insulin-requiring or dependent type II diabetes mellitus  Hypothyroidism  Chief Complaint  Patient presents with  . Inflammatory carcinoma left breast    HER-2/neu positive    CURRENT THERAPY: Workup completed for stage inflammatory breast cancer.  INTERVAL HISTORY: April Myers 80 y.o. female returns for followup and initiation of treatment for inflammatory carcinoma of the left breast with no evidence of distant metastases, HER-2/neu positive, ER/PR negative.  She's had a LifePort inserted and had PET scan performed. She offers no new complaints especially no increasing shortness of breath, PND, orthopnea. PET scan revealed a small pneumothorax and she will have repeat x-ray done today to insure that it has completely resorbed.  MEDICAL HISTORY: Past Medical History  Diagnosis Date  . Coronary artery disease   . Arthritis   . Cancer   . Anemia   . Diabetes mellitus without complication   . Hypercholesteremia   . Hypertension   . Hypothyroidism   . Anxiety   . Dementia   . Poor historian   . Cataract of both eyes     INTERIM HISTORY: has Breast cancer, left breast; Insulin-requiring or dependent type II diabetes mellitus; Hypothyroidism; and Pneumothorax, right, after life port insertion on her problem list.    ALLERGIES:  has No Known Allergies.  MEDICATIONS: has a current medication list which includes the following prescription(s): amlodipine, clopidogrel, hydrocodone-acetaminophen, hydrocortisone, insulin glargine, levothyroxine, loperamide, loratadine, metformin, simvastatin, zolpidem, lidocaine-prilocaine, metoclopramide, and prochlorperazine.  SURGICAL HISTORY:  Past  Surgical History  Procedure Laterality Date  . Mastectomy Right 2006  . Portacath placement N/A 02/21/2014    Procedure: INSERTION POWER PORT-A-CATH RIGHT SUBCLAVIAN;  Surgeon: Jamesetta So, MD;  Location: [facility];  Service: General;  Laterality: N/A;    FAMILY HISTORY: family history is not on file.  SOCIAL HISTORY:  reports that she has quit smoking. Her smoking use included Cigarettes. She smoked 0.00 packs per day. She does not have any smokeless tobacco history on file. She reports that she does not drink alcohol or use illicit drugs.  REVIEW OF SYSTEMS:  Other than that discussed above is noncontributory.  PHYSICAL EXAMINATION: ECOG PERFORMANCE STATUS: 1 - Symptomatic but completely ambulatory  Blood pressure 182/91, pulse 77, temperature 96.8 F (36 C), temperature source Oral, resp. rate 18, SpO2 96.00%.  GENERAL:alert, no distress and comfortable. SKIN: skin color, texture, turgor are normal, no rashes or significant lesions EYES: PERLA; Conjunctiva are pink and non-injected, sclera clear SINUSES: No redness or tenderness over maxillary or ethmoid sinuses OROPHARYNX:no exudate, no erythema on lips, buccal mucosa, or tongue. NECK: supple, thyroid normal size, non-tender, without nodularity. No masses CHEST: Left breast replaced by a large mass with peau d'orange with palpable axillary nodes. LYMPH:  no palpable lymphadenopathy in the cervical, axillary or inguinal LUNGS: clear to auscultation and percussion with normal breathing effort HEART: regular rate & rhythm and no murmurs. ABDOMEN:abdomen soft, non-tender and normal bowel sounds MUSCULOSKELETAL:no cyanosis of digits and no clubbing. Range of motion normal.  NEURO: alert & oriented x 3 with fluent speech, no focal motor/sensory deficits   LABORATORY DATA: Hospital Outpatient Visit on 03/01/2014  Component Date Value Ref Range Status  . Glucose-Capillary 03/01/2014 139* 70 - 99  mg/dL Final  Admission on 02/21/2014,  Discharged on 02/21/2014  Component Date Value Ref Range Status  . Glucose-Capillary 02/21/2014 116* 70 - 99 mg/dL Final  . Glucose-Capillary 02/21/2014 132* 70 - 99 mg/dL Final  Office Visit on 02/09/2014  Component Date Value Ref Range Status  . WBC 02/09/2014 7.8  4.0 - 10.5 K/uL Final  . RBC 02/09/2014 3.81* 3.87 - 5.11 MIL/uL Final  . Hemoglobin 02/09/2014 10.7* 12.0 - 15.0 g/dL Final  . HCT 02/09/2014 32.8* 36.0 - 46.0 % Final  . MCV 02/09/2014 86.1  78.0 - 100.0 fL Final  . MCH 02/09/2014 28.1  26.0 - 34.0 pg Final  . MCHC 02/09/2014 32.6  30.0 - 36.0 g/dL Final  . RDW 02/09/2014 14.5  11.5 - 15.5 % Final  . Platelets 02/09/2014 343  150 - 400 K/uL Final  . Neutrophils Relative % 02/09/2014 73  43 - 77 % Final  . Neutro Abs 02/09/2014 5.7  1.7 - 7.7 K/uL Final  . Lymphocytes Relative 02/09/2014 18  12 - 46 % Final  . Lymphs Abs 02/09/2014 1.4  0.7 - 4.0 K/uL Final  . Monocytes Relative 02/09/2014 8  3 - 12 % Final  . Monocytes Absolute 02/09/2014 0.6  0.1 - 1.0 K/uL Final  . Eosinophils Relative 02/09/2014 1  0 - 5 % Final  . Eosinophils Absolute 02/09/2014 0.1  0.0 - 0.7 K/uL Final  . Basophils Relative 02/09/2014 0  0 - 1 % Final  . Basophils Absolute 02/09/2014 0.0  0.0 - 0.1 K/uL Final  . Sodium 02/09/2014 136* 137 - 147 mEq/L Final  . Potassium 02/09/2014 4.2  3.7 - 5.3 mEq/L Final  . Chloride 02/09/2014 96  96 - 112 mEq/L Final  . CO2 02/09/2014 28  19 - 32 mEq/L Final  . Glucose, Bld 02/09/2014 159* 70 - 99 mg/dL Final  . BUN 02/09/2014 15  6 - 23 mg/dL Final  . Creatinine, Ser 02/09/2014 0.87  0.50 - 1.10 mg/dL Final  . Calcium 02/09/2014 9.0  8.4 - 10.5 mg/dL Final  . Total Protein 02/09/2014 8.0  6.0 - 8.3 g/dL Final  . Albumin 02/09/2014 3.0* 3.5 - 5.2 g/dL Final  . AST 02/09/2014 51* 0 - 37 U/L Final  . ALT 02/09/2014 46* 0 - 35 U/L Final  . Alkaline Phosphatase 02/09/2014 82  39 - 117 U/L Final  . Total Bilirubin 02/09/2014 0.2* 0.3 - 1.2 mg/dL Final  . GFR  calc non Af Amer 02/09/2014 57* >90 mL/min Final  . GFR calc Af Amer 02/09/2014 66* >90 mL/min Final   Comment: (NOTE)                          The eGFR has been calculated using the CKD EPI equation.                          This calculation has not been validated in all clinical situations.                          eGFR's persistently <90 mL/min signify possible Chronic Kidney                          Disease.  . CEA 02/09/2014 2.0  0.0 - 5.0 ng/mL Final   Performed at [facility]  . CA 27.29 02/09/2014 28  0 - 39 U/mL Final   Performed at [facility]: ER/PR negative, HER-2/neu over amplified  Urinalysis No results found for this basename: colorurine,  appearanceur,  labspec,  phurine,  glucoseu,  hgbur,  bilirubinur,  ketonesur,  proteinur,  urobilinogen,  nitrite,  leukocytesur    RADIOGRAPHIC STUDIES: Dg Chest 1 View  02/16/2014   CLINICAL DATA:  Left breast cancer  EXAM: CHEST - 1 VIEW  COMPARISON:  04/17/2005  FINDINGS: Cardiomegaly again noted. No pulmonary edema. Surgical clips at the level of thoracic inlet again noted. Stable left basilar atelectasis or infiltrate. No pneumothorax.  IMPRESSION: Cardiomegaly. Stable left basilar atelectasis or infiltrate. No pneumothorax.   Electronically Signed   By: Lahoma Crocker M.D.   On: 02/16/2014 14:05   Nm Cardiac Muga Rest  02/16/2014   CLINICAL DATA:  Pre antibody therapy cardiac evaluation.  EXAM: NUCLEAR MEDICINE CARDIAC BLOOD POOL IMAGING (MUGA)  TECHNIQUE: Cardiac multi-gated acquisition was performed at rest following intravenous injection of Tc-62mlabeled red blood cells.  COMPARISON:  None.  RADIOPHARMACEUTICALS:  25MILLI CURIE ULTRATAG TECHNETIUM TC 14M-LABELED RED BLOOD CELLS IV KITmCiTc-913mn-vitro labeled red blood cells.  FINDINGS: No focal wall motion abnormality.  Calculated left ventricular ejection fraction equals 58%  IMPRESSION: Calculated left ventricle ejection fraction equals 58%    Electronically Signed   By: StSuzy Bouchard.D.   On: 02/16/2014 16:32   Nm Pet Image Initial (pi) Skull Base To Thigh  03/01/2014   CLINICAL DATA:  Initial treatment strategy for breast carcinoma. Left breast carcinoma. Prior history of right breast carcinoma with mastectomy.  EXAM: NUCLEAR MEDICINE PET SKULL BASE TO THIGH  TECHNIQUE: 11.5 mCi F-18 FDG was injected intravenously. Full-ring PET imaging was performed from the skull base to thigh after the radiotracer. CT data was obtained and used for attenuation correction and anatomic localization.  FASTING BLOOD GLUCOSE:  Value: 139 mg/dl  COMPARISON:  None.  FINDINGS: NECK  No hypermetabolic lymph nodes in the neck.  CHEST  There is a cluster of hypermetabolic lesions within the lateral left breast with intense metabolic activity (SUV max 12.3). There are adjacent enlarged left axillary lymph nodes measuring 18 mm short axis (image 61) with SUV max 9.4. No hypermetabolic internal mammary lymph nodes. No hypermetabolic supraclavicular or infraclavicular nodes. No hypermetabolic mediastinal nodes.  Review of the lung parenchyma demonstrates a small anterior right pneumothorax extending to the lung bases. Estimated volume of this pneumothorax is 10% of the right hemi thorax volume. There is small right effusion.  There is mild metabolic activity along the tract of the port beneath the clavicle also likely related to inflammation from port placement.  ABDOMEN/PELVIS  No evidence of liver metastasis. No hypermetabolic abdominal pelvic lymph nodes. Renal glands are normal.  There is a large ventral hernia which to the long segment of nonobstructed small bowel. No aggressive osseous lesion. Chronic calcified granulation lateral to the left hip (image 164_.  SKELETON  No focal hypermetabolic activity to suggest skeletal metastasis.  IMPRESSION: 1. Hypermetabolic primary breast cancer in the left lateral breast. 2. Hypermetabolic axillary nodal metastasis on the  left. 3. No evidence of central nodal metastasis or distant metastasis. 4. Small right anterior pneumothorax and pleural fluid related to port placement on 02/21/2014. [facility] 03/01/2014  at12:45.   Electronically Signed   By: StSuzy Bouchard.D.   On: 03/01/2014 12:46   Dg Chest Port 1 View  02/21/2014   CLINICAL  DATA:  Post right side Port-A-Cath insertion  EXAM: PORTABLE CHEST - 1 VIEW  COMPARISON:  Portable exam 1123 hr compared to 02/16/2014  FINDINGS: Right subclavian Port-A-Cath with tip projecting over SVC.  Enlargement of cardiac silhouette.  Mediastinal contours and pulmonary vascularity normal.  Rotation to the left.  No gross infiltrate, pleural effusion, or pneumothorax.  Bones appear demineralized.  IMPRESSION: No pneumothorax following right subclavian line placement.  Enlargement of cardiac silhouette.   Electronically Signed   By: Lavonia Dana M.D.   On: 02/21/2014 11:35   Dg C-arm 61-120 Min-no Report  02/21/2014   CLINICAL DATA: port a cath placement in OR   C-ARM 61-120 MINUTES  Fluoroscopy was utilized by the requesting physician.  No radiographic  interpretation.     ASSESSMENT:  #1. Inflammatory left breast cancer with no evidence of distant spread. #2. Small pneumothorax, for complete evaluation prior to discharge today. This occurred after light port was inserted even though the x-ray done immediately after the insertion did not show the pneumothorax. It was discovered on subsequent PET scan. #3. Diabetes mellitus, type II, requiring, controlled. #4. Hypothyroidism, on treatment. #5. Peripheral vascular disease, currently on Plavix.   PLAN:  #1. Weekly Taxol for 12 weeks in conjunction with pertuzumab and trastuzumab given every 3 weeks.  MUGA scan showed ejection fraction of 58% as baseline. #2. Treatment to initiate on 03/09/2014 at which time the patient will be seen again. Her power of attorney was present during this discussion and all  are in agreement with this strategy. #3. Chest x-ray PA and lateral prior to discharge today.  All questions were answered. The patient knows to call the clinic with any problems, questions or concerns. We can certainly see the patient much sooner if necessary.   I spent 25 minutes counseling the patient face to face. The total time spent in the appointment was 30 minutes.    Doroteo Bradford, MD 03/03/2014 3:45 PM

## 2014-03-03 NOTE — Patient Instructions (Signed)
[facility] Discharge Instructions  RECOMMENDATIONS MADE BY THE CONSULTANT AND ANY TEST RESULTS WILL BE SENT TO YOUR REFERRING PHYSICIAN. We will see you next Wednesday to begin treatment.   You will see the doctor at that time.  Thank you for choosing [facility] to provide your oncology and hematology care.  To afford each patient quality time with our providers, please arrive at least 15 minutes before your scheduled appointment time.  With your help, our goal is to use those 15 minutes to complete the necessary work-up to ensure our physicians have the information they need to help with your evaluation and healthcare recommendations.    Effective January 1st, 2014, we ask that you re-schedule your appointment with our physicians should you arrive 10 or more minutes late for your appointment.  We strive to give you quality time with our providers, and arriving late affects you and other patients whose appointments are after yours.    Again, thank you for choosing [facility].  Our hope is that these requests will decrease the amount of time that you wait before being seen by our physicians.       _____________________________________________________________  Should you have questions after your visit to [facility], please contact our office at [phone] between the hours of 8:30 a.m. and 5:00 p.m.  Voicemails left after 4:30 p.m. will not be returned until the following business day.  For prescription refill requests, have your pharmacy contact our office with your prescription refill request.

## 2014-03-07 ENCOUNTER — Inpatient Hospital Stay (HOSPITAL_COMMUNITY): Payer: Medicare Other

## 2014-03-09 ENCOUNTER — Encounter (HOSPITAL_BASED_OUTPATIENT_CLINIC_OR_DEPARTMENT_OTHER): Payer: Medicare Other

## 2014-03-09 VITALS — BP 145/74 | HR 100 | Temp 98.1°F | Resp 20 | Wt 214.6 lb

## 2014-03-09 DIAGNOSIS — C50919 Malignant neoplasm of unspecified site of unspecified female breast: Secondary | ICD-10-CM

## 2014-03-09 DIAGNOSIS — C50912 Malignant neoplasm of unspecified site of left female breast: Secondary | ICD-10-CM

## 2014-03-09 DIAGNOSIS — Z853 Personal history of malignant neoplasm of breast: Secondary | ICD-10-CM

## 2014-03-09 DIAGNOSIS — Z5112 Encounter for antineoplastic immunotherapy: Secondary | ICD-10-CM

## 2014-03-09 DIAGNOSIS — Z171 Estrogen receptor negative status [ER-]: Secondary | ICD-10-CM

## 2014-03-09 LAB — COMPREHENSIVE METABOLIC PANEL
ALT: 7 U/L (ref 0–35)
AST: 13 U/L (ref 0–37)
Albumin: 3.3 g/dL — ABNORMAL LOW (ref 3.5–5.2)
Alkaline Phosphatase: 71 U/L (ref 39–117)
BILIRUBIN TOTAL: 0.3 mg/dL (ref 0.3–1.2)
BUN: 16 mg/dL (ref 6–23)
CHLORIDE: 99 meq/L (ref 96–112)
CO2: 30 mEq/L (ref 19–32)
Calcium: 9.3 mg/dL (ref 8.4–10.5)
Creatinine, Ser: 0.85 mg/dL (ref 0.50–1.10)
GFR calc Af Amer: 68 mL/min — ABNORMAL LOW (ref >90)
GFR calc non Af Amer: 59 mL/min — ABNORMAL LOW (ref >90)
GLUCOSE: 169 mg/dL — AB (ref 70–99)
POTASSIUM: 4.4 meq/L (ref 3.7–5.3)
SODIUM: 139 meq/L (ref 137–147)
Total Protein: 8.1 g/dL (ref 6.0–8.3)

## 2014-03-09 LAB — CBC WITH DIFFERENTIAL/PLATELET
Basophils Absolute: 0 10*3/uL (ref 0.0–0.1)
Basophils Relative: 0 % (ref 0–1)
Eosinophils Absolute: 0.1 10*3/uL (ref 0.0–0.7)
Eosinophils Relative: 2 % (ref 0–5)
HCT: 33.4 % — ABNORMAL LOW (ref 36.0–46.0)
HEMOGLOBIN: 10.6 g/dL — AB (ref 12.0–15.0)
LYMPHS ABS: 1.3 10*3/uL (ref 0.7–4.0)
Lymphocytes Relative: 28 % (ref 12–46)
MCH: 27.5 pg (ref 26.0–34.0)
MCHC: 31.7 g/dL (ref 30.0–36.0)
MCV: 86.5 fL (ref 78.0–100.0)
MONOS PCT: 7 % (ref 3–12)
Monocytes Absolute: 0.3 10*3/uL (ref 0.1–1.0)
NEUTROS ABS: 2.9 10*3/uL (ref 1.7–7.7)
NEUTROS PCT: 63 % (ref 43–77)
Platelets: 262 10*3/uL (ref 150–400)
RBC: 3.86 MIL/uL — AB (ref 3.87–5.11)
RDW: 15.7 % — ABNORMAL HIGH (ref 11.5–15.5)
WBC: 4.6 10*3/uL (ref 4.0–10.5)

## 2014-03-09 MED ORDER — PERTUZUMAB CHEMO INJECTION 420 MG/14ML
840.0000 mg | Freq: Once | INTRAVENOUS | Status: AC
  Administered 2014-03-09: 840 mg via INTRAVENOUS
  Filled 2014-03-09: qty 28

## 2014-03-09 MED ORDER — ACETAMINOPHEN 325 MG PO TABS
650.0000 mg | ORAL_TABLET | Freq: Once | ORAL | Status: AC
  Administered 2014-03-09: 650 mg via ORAL

## 2014-03-09 MED ORDER — HEPARIN SOD (PORK) LOCK FLUSH 100 UNIT/ML IV SOLN
500.0000 [IU] | Freq: Once | INTRAVENOUS | Status: AC | PRN
  Administered 2014-03-09: 500 [IU]
  Filled 2014-03-09: qty 5

## 2014-03-09 MED ORDER — DIPHENHYDRAMINE HCL 25 MG PO CAPS: 50.0000 mg | ORAL_CAPSULE | Freq: Once | ORAL | Status: DC

## 2014-03-09 MED ORDER — SODIUM CHLORIDE 0.9 % IV SOLN: 8.0000 mg | Freq: Once | INTRAVENOUS | Status: DC

## 2014-03-09 MED ORDER — TRASTUZUMAB CHEMO INJECTION 440 MG
8.0000 mg/kg | Freq: Once | INTRAVENOUS | Status: AC
  Administered 2014-03-09: 819 mg via INTRAVENOUS
  Filled 2014-03-09: qty 39

## 2014-03-09 MED ORDER — SODIUM CHLORIDE 0.9 % IV SOLN
Freq: Once | INTRAVENOUS | Status: AC
  Administered 2014-03-09: 8 mg via INTRAVENOUS
  Filled 2014-03-09: qty 4

## 2014-03-09 MED ORDER — ACETAMINOPHEN 325 MG PO TABS
ORAL_TABLET | ORAL | Status: AC
  Filled 2014-03-09: qty 2

## 2014-03-09 MED ORDER — FAMOTIDINE IN NACL 20-0.9 MG/50ML-% IV SOLN
20.0000 mg | Freq: Once | INTRAVENOUS | Status: AC
  Administered 2014-03-09: 20 mg via INTRAVENOUS
  Filled 2014-03-09: qty 50

## 2014-03-09 MED ORDER — SODIUM CHLORIDE 0.9 % IJ SOLN
10.0000 mL | INTRAMUSCULAR | Status: DC | PRN
  Administered 2014-03-09: 10 mL

## 2014-03-09 MED ORDER — DEXAMETHASONE SODIUM PHOSPHATE 10 MG/ML IJ SOLN: 20.0000 mg | Freq: Once | INTRAMUSCULAR | Status: DC

## 2014-03-09 MED ORDER — SODIUM CHLORIDE 0.9 % IV SOLN
Freq: Once | INTRAVENOUS | Status: AC
  Administered 2014-03-09: 09:00:00 via INTRAVENOUS

## 2014-03-09 MED ORDER — DIPHENHYDRAMINE HCL 50 MG/ML IJ SOLN
50.0000 mg | Freq: Once | INTRAMUSCULAR | Status: AC
  Administered 2014-03-09: 50 mg via INTRAVENOUS
  Filled 2014-03-09: qty 1

## 2014-03-09 MED ORDER — DEXTROSE 5 % IV SOLN
80.0000 mg/m2 | Freq: Once | INTRAVENOUS | Status: AC
  Administered 2014-03-09: 174 mg via INTRAVENOUS
  Filled 2014-03-09: qty 29

## 2014-03-09 NOTE — Progress Notes (Signed)
Rush Farmer Tolerated chemotheray well today

## 2014-03-09 NOTE — Patient Instructions (Signed)
[facility] Discharge Instructions for Patients Receiving Chemotherapy  Today you received the following chemotherapy agents herceptin, perjeta, and taxol.  To help prevent nausea and vomiting after your treatment, we encourage you to take your nausea medication as ordered   If you develop nausea and vomiting that is not controlled by your nausea medication, call the clinic. If it is after clinic hours your family physician or the after hours number for the clinic or go to the Emergency Department.   BELOW ARE SYMPTOMS THAT SHOULD BE REPORTED IMMEDIATELY:  *FEVER GREATER THAN 101.0 F  *CHILLS WITH OR WITHOUT FEVER  NAUSEA AND VOMITING THAT IS NOT CONTROLLED WITH YOUR NAUSEA MEDICATION  *UNUSUAL SHORTNESS OF BREATH  *UNUSUAL BRUISING OR BLEEDING  TENDERNESS IN MOUTH AND THROAT WITH OR WITHOUT PRESENCE OF ULCERS  *URINARY PROBLEMS  *BOWEL PROBLEMS  UNUSUAL RASH Items with * indicate a potential emergency and should be followed up as soon as possible.  One of the nurses will contact you 24 hours after your treatment. Please let the nurse know about any problems that you may have experienced. Feel free to call the clinic you have any questions or concerns. The clinic phone number is [phone].

## 2014-03-10 ENCOUNTER — Telehealth (HOSPITAL_COMMUNITY): Payer: Self-pay | Admitting: *Deleted

## 2014-03-10 NOTE — Telephone Encounter (Signed)
Call placed to [facility] and spoke with Butch Penny and she states patient is doing fine and has no c/o nausea or vomiting.

## 2014-03-14 ENCOUNTER — Inpatient Hospital Stay (HOSPITAL_COMMUNITY): Payer: Medicare Other

## 2014-03-16 ENCOUNTER — Ambulatory Visit (HOSPITAL_COMMUNITY): Payer: Medicare Other

## 2014-03-16 ENCOUNTER — Encounter (HOSPITAL_BASED_OUTPATIENT_CLINIC_OR_DEPARTMENT_OTHER): Payer: Medicare Other | Admitting: Oncology

## 2014-03-16 ENCOUNTER — Encounter (HOSPITAL_COMMUNITY): Payer: Medicare Other | Attending: Hematology and Oncology

## 2014-03-16 VITALS — BP 126/68 | HR 74 | Temp 97.1°F | Resp 20 | Wt 212.8 lb

## 2014-03-16 DIAGNOSIS — C50912 Malignant neoplasm of unspecified site of left female breast: Secondary | ICD-10-CM

## 2014-03-16 DIAGNOSIS — Z171 Estrogen receptor negative status [ER-]: Secondary | ICD-10-CM

## 2014-03-16 DIAGNOSIS — C50919 Malignant neoplasm of unspecified site of unspecified female breast: Secondary | ICD-10-CM

## 2014-03-16 DIAGNOSIS — E119 Type 2 diabetes mellitus without complications: Secondary | ICD-10-CM

## 2014-03-16 DIAGNOSIS — Z5111 Encounter for antineoplastic chemotherapy: Secondary | ICD-10-CM

## 2014-03-16 LAB — CBC WITH DIFFERENTIAL/PLATELET
BASOS PCT: 1 % (ref 0–1)
Basophils Absolute: 0 10*3/uL (ref 0.0–0.1)
EOS PCT: 3 % (ref 0–5)
Eosinophils Absolute: 0.1 10*3/uL (ref 0.0–0.7)
HEMATOCRIT: 30.4 % — AB (ref 36.0–46.0)
Hemoglobin: 9.7 g/dL — ABNORMAL LOW (ref 12.0–15.0)
Lymphocytes Relative: 25 % (ref 12–46)
Lymphs Abs: 0.9 10*3/uL (ref 0.7–4.0)
MCH: 27.9 pg (ref 26.0–34.0)
MCHC: 31.9 g/dL (ref 30.0–36.0)
MCV: 87.4 fL (ref 78.0–100.0)
MONO ABS: 0.1 10*3/uL (ref 0.1–1.0)
Monocytes Relative: 3 % (ref 3–12)
NEUTROS ABS: 2.4 10*3/uL (ref 1.7–7.7)
Neutrophils Relative %: 68 % (ref 43–77)
Platelets: 250 10*3/uL (ref 150–400)
RBC: 3.48 MIL/uL — ABNORMAL LOW (ref 3.87–5.11)
RDW: 15.2 % (ref 11.5–15.5)
WBC: 3.5 10*3/uL — AB (ref 4.0–10.5)

## 2014-03-16 MED ORDER — SODIUM CHLORIDE 0.9 % IV SOLN: 8.0000 mg | Freq: Once | INTRAVENOUS | Status: DC

## 2014-03-16 MED ORDER — SODIUM CHLORIDE 0.9 % IV SOLN
Freq: Once | INTRAVENOUS | Status: AC
  Administered 2014-03-16: 8 mg via INTRAVENOUS
  Filled 2014-03-16: qty 4

## 2014-03-16 MED ORDER — PACLITAXEL CHEMO INJECTION 300 MG/50ML
80.0000 mg/m2 | Freq: Once | INTRAVENOUS | Status: AC
  Administered 2014-03-16: 174 mg via INTRAVENOUS
  Filled 2014-03-16: qty 29

## 2014-03-16 MED ORDER — DIPHENHYDRAMINE HCL 50 MG/ML IJ SOLN
50.0000 mg | Freq: Once | INTRAMUSCULAR | Status: AC
Start: 2014-03-16 — End: 2014-03-16
  Administered 2014-03-16: 50 mg via INTRAVENOUS
  Filled 2014-03-16: qty 1

## 2014-03-16 MED ORDER — FAMOTIDINE IN NACL 20-0.9 MG/50ML-% IV SOLN
20.0000 mg | Freq: Once | INTRAVENOUS | Status: AC
  Administered 2014-03-16: 20 mg via INTRAVENOUS
  Filled 2014-03-16: qty 50

## 2014-03-16 MED ORDER — HEPARIN SOD (PORK) LOCK FLUSH 100 UNIT/ML IV SOLN
500.0000 [IU] | Freq: Once | INTRAVENOUS | Status: AC | PRN
Start: 2014-03-16 — End: 2014-03-16
  Administered 2014-03-16: 500 [IU]
  Filled 2014-03-16: qty 5

## 2014-03-16 MED ORDER — DEXAMETHASONE SODIUM PHOSPHATE 10 MG/ML IJ SOLN: 20.0000 mg | Freq: Once | INTRAMUSCULAR | Status: DC

## 2014-03-16 MED ORDER — SODIUM CHLORIDE 0.9 % IV SOLN
Freq: Once | INTRAVENOUS | Status: AC
  Administered 2014-03-16: 09:00:00 via INTRAVENOUS

## 2014-03-16 MED ORDER — SODIUM CHLORIDE 0.9 % IJ SOLN: 10.0000 mL | INTRAMUSCULAR | Status: DC | PRN

## 2014-03-16 NOTE — Progress Notes (Signed)
[facility], MD [address] [place] [address]  Breast cancer, left breast  CURRENT THERAPY: Taxol/Herceptin/Perjecta beginning on 03/08/2014  INTERVAL HISTORY: April Myers 80 y.o. female returns for  regular  visit for followup of Stage IIIB inflammatory breast cancer, ER/PR negative, but Her2 positive.  Started on Taxol/Herceptin/Perjeta weekly on 03/08/2014.    Breast cancer, left breast   01/19/2014 Initial Diagnosis Breast cancer, left breast, ER/PR negative, HER-2/neu overexpressed, estimated 10 cm in size   03/09/2014 -  Chemotherapy Taxol/Pertuzumab/Trastuzumab    I personally reviewed and went over laboratory results with the patient.  The results are noted within this dictation.  She reports that she tolerated her first dose of chemotherapy well.  She denies any nausea or vomiting.   She asks about her chemotherapy regimen which consists of Docetaxel weekly x 12 and Herceptin/Perjeta every 3 weeks x 52 weeks.  She asks about how she can loose weight and I made a suggestion of healthy eating habits.  I do not recommend any intense weight loss intervention while she is on chemotherapy, because her appetite may diminish while on chemotherapy and given her age, we do not want her to loose too much weight while receiving chemotherapy.  "The reason I ask is that I may be getting married soon."  Oncologically, she denies any complaints and ROS questioning is negative.    Past Medical History  Diagnosis Date  . Coronary artery disease   . Arthritis   . Cancer   . Anemia   . Diabetes mellitus without complication   . Hypercholesteremia   . Hypertension   . Hypothyroidism   . Anxiety   . Dementia   . Poor historian   . Cataract of both eyes     has Breast cancer, left breast; Insulin-requiring or dependent type II diabetes mellitus; Hypothyroidism; and Pneumothorax, right, after life port insertion on her problem list.     has No Known  Allergies.  Ms. Friel does not currently have medications on file.  Past Surgical History  Procedure Laterality Date  . Mastectomy Right 2006  . Portacath placement N/A 02/21/2014    Procedure: INSERTION POWER PORT-A-CATH RIGHT SUBCLAVIAN;  Surgeon: Jamesetta So, MD;  Location: [facility];  Service: General;  Laterality: N/A;    Denies any headaches, dizziness, double vision, fevers, chills, night sweats, nausea, vomiting, diarrhea, constipation, chest pain, heart palpitations, shortness of breath, blood in stool, black tarry stool, urinary pain, urinary burning, urinary frequency, hematuria.   PHYSICAL EXAMINATION  ECOG PERFORMANCE STATUS: 2 - Symptomatic, <50% confined to bed  There were no vitals filed for this visit.  GENERAL:alert, no distress, well nourished, well developed, comfortable, cooperative, obese, smiling and appears 20 years younger than her stated age, in chemotherapy recliner starting IV chemotherapy. SKIN: skin color, texture, turgor are normal, no rashes or significant lesions HEAD: Normocephalic, No masses, lesions, tenderness or abnormalities EYES: normal, PERRLA, EOMI, Conjunctiva are pink and non-injected EARS: External ears normal OROPHARYNX:mucous membranes are moist  NECK: supple, trachea midline LYMPH:  not examined BREAST:not examined LUNGS: clear to auscultation  HEART: regular rate & rhythm, no murmurs and no gallops ABDOMEN:abdomen soft and normal bowel sounds BACK: Back symmetric, no curvature. EXTREMITIES:less then 2 second capillary refill, no joint deformities, effusion, or inflammation, no skin discoloration, no cyanosis  NEURO: alert & oriented x 3 with fluent speech, no focal motor/sensory deficits.  Slowed mentation.   LABORATORY DATA: CBC    Component Value Date/Time  WBC 3.5* 03/16/2014 0847   RBC 3.48* 03/16/2014 0847   HGB 9.7* 03/16/2014 0847   HCT 30.4* 03/16/2014 0847   PLT 250 03/16/2014 0847   MCV 87.4 03/16/2014 0847   MCH 27.9  03/16/2014 0847   MCHC 31.9 03/16/2014 0847   RDW 15.2 03/16/2014 0847   LYMPHSABS 0.9 03/16/2014 0847   MONOABS 0.1 03/16/2014 0847   EOSABS 0.1 03/16/2014 0847   BASOSABS 0.0 03/16/2014 0847      Chemistry      Component Value Date/Time   NA 139 03/09/2014 0912   K 4.4 03/09/2014 0912   CL 99 03/09/2014 0912   CO2 30 03/09/2014 0912   BUN 16 03/09/2014 0912   CREATININE 0.85 03/09/2014 0912      Component Value Date/Time   CALCIUM 9.3 03/09/2014 0912   ALKPHOS 71 03/09/2014 0912   AST 13 03/09/2014 0912   ALT 7 03/09/2014 0912   BILITOT 0.3 03/09/2014 0912     Lab Results  Component Value Date   LABCA2 28 02/09/2014     RADIOGRAPHIC STUDIES:  03/03/2014  CLINICAL DATA: Tiny right  EXAM:  CHEST 2 VIEW  COMPARISON: Chest x-ray dated 02/21/2014  FINDINGS:  Right-sided power port is in place. There is a tiny right apical  pneumothorax. Lungs are clear. Heart size and vascularity are  normal. Multiple surgical clips in the right axilla secondary to  prior right mastectomy.  IMPRESSION:  Tiny right apical pneumothorax.  Electronically Signed  By: Rozetta Nunnery M.D.  On: 03/03/2014 16:22    ASSESSMENT:  1. Stage IIIB inflammatory breast cancer, ER/PR negative, but Her2 positive.  Started on Taxol/Herceptin/Perjeta weekly on 03/08/2014. 2. Tiny right apical pneumothorax 3. Diabetes mellitus, type II, requiring, controlled. 4. Hypothyroidism, on treatment. 5. Peripheral vascular disease, currently on Plavix. 6. Slowed mentation  Patient Active Problem List   Diagnosis Date Noted  . Pneumothorax, right, after life port insertion 03/03/2014  . Insulin-requiring or dependent type II diabetes mellitus 02/09/2014  . Hypothyroidism 02/09/2014  . Breast cancer, left breast 02/08/2014     PLAN:  1. I personally reviewed and went over laboratory results with the patient.  The results are noted within this dictation. 2. I personally reviewed and went over radiographic studies with the  patient.  The results are noted within this dictation.   3. Chemotherapy today as planned. 4. MUGA scan in 12 weeks (end of May 2015) 5. Pre-chemo labs: CBC diff weekly and CMET every 3 weeks 6. Return as scheduled in 2 weeks.    THERAPY PLAN:  Weekly Taxol for 12 weeks in conjunction with pertuzumab and trastuzumab given every 3 weeks.  We will manage any toxicities/side effects that may occur, but this regimen is typically very well tolerated, even in the elderly population.  All questions were answered. The patient knows to call the clinic with any problems, questions or concerns. We can certainly see the patient much sooner if necessary.  Patient and plan discussed with Dr. Farrel Gobble and he is in agreement with the aforementioned.   Mackenzie Groom 03/16/2014

## 2014-03-16 NOTE — Patient Instructions (Signed)
[facility] Discharge Instructions  RECOMMENDATIONS MADE BY THE CONSULTANT AND ANY TEST RESULTS WILL BE SENT TO YOUR REFERRING PHYSICIAN.  MEDICATIONS PRESCRIBED:  None  INSTRUCTIONS GIVEN AND DISCUSSED: Do not recommend weight loss intervention other than healthy eating habits.    SPECIAL INSTRUCTIONS/FOLLOW-UP: Pre-chemotherapy labs as scheduled.  Chemotherapy as planned- weekly Docetaxel and every 3 weeks Herceptin/Perjeta. Return in 2 weeks for follow-up appointment with provider.   Thank you for choosing [facility] to provide your oncology and hematology care.  To afford each patient quality time with our providers, please arrive at least 15 minutes before your scheduled appointment time.  With your help, our goal is to use those 15 minutes to complete the necessary work-up to ensure our physicians have the information they need to help with your evaluation and healthcare recommendations.    Effective January 1st, 2014, we ask that you re-schedule your appointment with our physicians should you arrive 10 or more minutes late for your appointment.  We strive to give you quality time with our providers, and arriving late affects you and other patients whose appointments are after yours.    Again, thank you for choosing [facility].  Our hope is that these requests will decrease the amount of time that you wait before being seen by our physicians.       _____________________________________________________________  Should you have questions after your visit to [facility], please contact our office at [phone] between the hours of 8:30 a.m. and 5:00 p.m.  Voicemails left after 4:30 p.m. will not be returned until the following business day.  For prescription refill requests, have your pharmacy contact our office with your prescription refill request.

## 2014-03-16 NOTE — Progress Notes (Signed)
Rush Farmer Tolerated chemotherapy well today Discharged via wheelchair with [facility]

## 2014-03-23 ENCOUNTER — Encounter (HOSPITAL_BASED_OUTPATIENT_CLINIC_OR_DEPARTMENT_OTHER): Payer: Medicare Other

## 2014-03-23 VITALS — BP 140/62 | HR 92 | Temp 97.6°F | Resp 18 | Wt 197.6 lb

## 2014-03-23 DIAGNOSIS — Z5111 Encounter for antineoplastic chemotherapy: Secondary | ICD-10-CM

## 2014-03-23 DIAGNOSIS — C50919 Malignant neoplasm of unspecified site of unspecified female breast: Secondary | ICD-10-CM

## 2014-03-23 DIAGNOSIS — C50912 Malignant neoplasm of unspecified site of left female breast: Secondary | ICD-10-CM

## 2014-03-23 DIAGNOSIS — C50419 Malignant neoplasm of upper-outer quadrant of unspecified female breast: Secondary | ICD-10-CM

## 2014-03-23 LAB — CBC WITH DIFFERENTIAL/PLATELET
Basophils Absolute: 0 10*3/uL (ref 0.0–0.1)
Basophils Relative: 0 % (ref 0–1)
EOS ABS: 0 10*3/uL (ref 0.0–0.7)
EOS PCT: 1 % (ref 0–5)
HEMATOCRIT: 32.2 % — AB (ref 36.0–46.0)
Hemoglobin: 10.8 g/dL — ABNORMAL LOW (ref 12.0–15.0)
LYMPHS ABS: 0.7 10*3/uL (ref 0.7–4.0)
LYMPHS PCT: 28 % (ref 12–46)
MCH: 27.8 pg (ref 26.0–34.0)
MCHC: 33.5 g/dL (ref 30.0–36.0)
MCV: 83 fL (ref 78.0–100.0)
MONOS PCT: 8 % (ref 3–12)
Monocytes Absolute: 0.2 10*3/uL (ref 0.1–1.0)
Neutro Abs: 1.5 10*3/uL — ABNORMAL LOW (ref 1.7–7.7)
Neutrophils Relative %: 64 % (ref 43–77)
Platelets: 388 10*3/uL (ref 150–400)
RBC: 3.88 MIL/uL (ref 3.87–5.11)
RDW: 15.4 % (ref 11.5–15.5)
WBC: 2.4 10*3/uL — ABNORMAL LOW (ref 4.0–10.5)

## 2014-03-23 MED ORDER — PACLITAXEL CHEMO INJECTION 300 MG/50ML
80.0000 mg/m2 | Freq: Once | INTRAVENOUS | Status: AC
  Administered 2014-03-23: 174 mg via INTRAVENOUS
  Filled 2014-03-23: qty 29

## 2014-03-23 MED ORDER — HEPARIN SOD (PORK) LOCK FLUSH 100 UNIT/ML IV SOLN
500.0000 [IU] | Freq: Once | INTRAVENOUS | Status: AC | PRN
  Administered 2014-03-23: 500 [IU]
  Filled 2014-03-23: qty 5

## 2014-03-23 MED ORDER — DEXAMETHASONE SODIUM PHOSPHATE 10 MG/ML IJ SOLN: 20.0000 mg | Freq: Once | INTRAMUSCULAR | Status: DC

## 2014-03-23 MED ORDER — SODIUM CHLORIDE 0.9 % IV SOLN
Freq: Once | INTRAVENOUS | Status: AC
  Administered 2014-03-23: 8 mg via INTRAVENOUS
  Filled 2014-03-23: qty 4

## 2014-03-23 MED ORDER — SODIUM CHLORIDE 0.9 % IV SOLN
Freq: Once | INTRAVENOUS | Status: AC
  Administered 2014-03-23: 10:00:00 via INTRAVENOUS

## 2014-03-23 MED ORDER — SODIUM CHLORIDE 0.9 % IJ SOLN: 10.0000 mL | INTRAMUSCULAR | Status: DC | PRN

## 2014-03-23 MED ORDER — DIPHENHYDRAMINE HCL 50 MG/ML IJ SOLN
50.0000 mg | Freq: Once | INTRAMUSCULAR | Status: AC
  Administered 2014-03-23: 50 mg via INTRAVENOUS
  Filled 2014-03-23: qty 1

## 2014-03-23 MED ORDER — SODIUM CHLORIDE 0.9 % IV SOLN: 8.0000 mg | Freq: Once | INTRAVENOUS | Status: DC

## 2014-03-23 MED ORDER — FAMOTIDINE IN NACL 20-0.9 MG/50ML-% IV SOLN
20.0000 mg | Freq: Once | INTRAVENOUS | Status: AC
  Administered 2014-03-23: 20 mg via INTRAVENOUS
  Filled 2014-03-23: qty 50

## 2014-03-23 NOTE — Patient Instructions (Signed)
[place]   CHEMOTHERAPY INSTRUCTIONS   POTENTIAL SIDE EFFECTS OF TREATMENT: Increased Susceptibility to Infection, Vomiting, Constipation, Hair Thinning, Changes in Character of Skin and Nails (brittleness, dryness,etc.), Bone Marrow Suppression, Nausea and Diarrhea  EDUCATIONAL MATERIALS GIVEN AND REVIEWED: Chemotherapy and You  SYMPTOMS TO REPORT AS SOON AS POSSIBLE AFTER TREATMENT:  FEVER GREATER THAN 100.5 F  CHILLS WITH OR WITHOUT FEVER  NAUSEA AND VOMITING THAT IS NOT CONTROLLED WITH YOUR NAUSEA MEDICATION  UNUSUAL SHORTNESS OF BREATH  UNUSUAL BRUISING OR BLEEDING  TENDERNESS IN MOUTH AND THROAT WITH OR WITHOUT PRESENCE OF ULCERS  URINARY PROBLEMS  BOWEL PROBLEMS  UNUSUAL RASH    Wear comfortable clothing and clothing appropriate for easy access to any Portacath or PICC line. Let us know if there is anything that we can do to make your therapy better!  I have been informed and understand all of the instructions given to me and have received a copy. I have been instructed to call the clinic [phone] or my family physician as soon as possible for continued medical care, if indicated. I do not have any more questions at this time but understand that I may call the [facility] or the Patient Navigator at [phone] during office hours should I have questions or need assistance in obtaining follow-up care.

## 2014-03-26 ENCOUNTER — Encounter (HOSPITAL_COMMUNITY): Payer: Self-pay | Admitting: Emergency Medicine

## 2014-03-26 ENCOUNTER — Inpatient Hospital Stay (HOSPITAL_COMMUNITY)
Admission: EM | Admit: 2014-03-26 | Discharge: 2014-04-15 | DRG: 871 | Disposition: E | Payer: Medicare Other | Attending: Internal Medicine | Admitting: Internal Medicine

## 2014-03-26 ENCOUNTER — Other Ambulatory Visit: Payer: Self-pay

## 2014-03-26 ENCOUNTER — Emergency Department (HOSPITAL_COMMUNITY): Payer: Medicare Other

## 2014-03-26 DIAGNOSIS — I2510 Atherosclerotic heart disease of native coronary artery without angina pectoris: Secondary | ICD-10-CM | POA: Diagnosis present

## 2014-03-26 DIAGNOSIS — Z794 Long term (current) use of insulin: Secondary | ICD-10-CM

## 2014-03-26 DIAGNOSIS — E7800 Pure hypercholesterolemia, unspecified: Secondary | ICD-10-CM | POA: Diagnosis present

## 2014-03-26 DIAGNOSIS — R6520 Severe sepsis without septic shock: Secondary | ICD-10-CM

## 2014-03-26 DIAGNOSIS — E872 Acidosis, unspecified: Secondary | ICD-10-CM | POA: Diagnosis present

## 2014-03-26 DIAGNOSIS — I10 Essential (primary) hypertension: Secondary | ICD-10-CM | POA: Diagnosis present

## 2014-03-26 DIAGNOSIS — Z853 Personal history of malignant neoplasm of breast: Secondary | ICD-10-CM

## 2014-03-26 DIAGNOSIS — D709 Neutropenia, unspecified: Secondary | ICD-10-CM | POA: Diagnosis present

## 2014-03-26 DIAGNOSIS — T451X5A Adverse effect of antineoplastic and immunosuppressive drugs, initial encounter: Secondary | ICD-10-CM | POA: Diagnosis present

## 2014-03-26 DIAGNOSIS — D6481 Anemia due to antineoplastic chemotherapy: Secondary | ICD-10-CM | POA: Diagnosis present

## 2014-03-26 DIAGNOSIS — Z87891 Personal history of nicotine dependence: Secondary | ICD-10-CM

## 2014-03-26 DIAGNOSIS — C50912 Malignant neoplasm of unspecified site of left female breast: Secondary | ICD-10-CM | POA: Diagnosis present

## 2014-03-26 DIAGNOSIS — J9600 Acute respiratory failure, unspecified whether with hypoxia or hypercapnia: Secondary | ICD-10-CM | POA: Diagnosis present

## 2014-03-26 DIAGNOSIS — Z9010 Acquired absence of unspecified breast and nipple: Secondary | ICD-10-CM

## 2014-03-26 DIAGNOSIS — E119 Type 2 diabetes mellitus without complications: Secondary | ICD-10-CM

## 2014-03-26 DIAGNOSIS — E785 Hyperlipidemia, unspecified: Secondary | ICD-10-CM | POA: Diagnosis present

## 2014-03-26 DIAGNOSIS — C50919 Malignant neoplasm of unspecified site of unspecified female breast: Secondary | ICD-10-CM | POA: Diagnosis present

## 2014-03-26 DIAGNOSIS — E860 Dehydration: Secondary | ICD-10-CM | POA: Diagnosis present

## 2014-03-26 DIAGNOSIS — F411 Generalized anxiety disorder: Secondary | ICD-10-CM | POA: Diagnosis present

## 2014-03-26 DIAGNOSIS — E039 Hypothyroidism, unspecified: Secondary | ICD-10-CM | POA: Diagnosis present

## 2014-03-26 DIAGNOSIS — I4891 Unspecified atrial fibrillation: Secondary | ICD-10-CM | POA: Diagnosis present

## 2014-03-26 DIAGNOSIS — G9341 Metabolic encephalopathy: Secondary | ICD-10-CM | POA: Diagnosis present

## 2014-03-26 DIAGNOSIS — D638 Anemia in other chronic diseases classified elsewhere: Secondary | ICD-10-CM | POA: Diagnosis present

## 2014-03-26 DIAGNOSIS — A419 Sepsis, unspecified organism: Principal | ICD-10-CM | POA: Diagnosis present

## 2014-03-26 DIAGNOSIS — Z79899 Other long term (current) drug therapy: Secondary | ICD-10-CM

## 2014-03-26 DIAGNOSIS — F0390 Unspecified dementia without behavioral disturbance: Secondary | ICD-10-CM | POA: Diagnosis present

## 2014-03-26 DIAGNOSIS — R5081 Fever presenting with conditions classified elsewhere: Secondary | ICD-10-CM | POA: Diagnosis present

## 2014-03-26 DIAGNOSIS — M1990 Unspecified osteoarthritis, unspecified site: Secondary | ICD-10-CM | POA: Diagnosis present

## 2014-03-26 DIAGNOSIS — N179 Acute kidney failure, unspecified: Secondary | ICD-10-CM | POA: Diagnosis present

## 2014-03-26 DIAGNOSIS — J189 Pneumonia, unspecified organism: Secondary | ICD-10-CM | POA: Diagnosis present

## 2014-03-26 LAB — HEPATIC FUNCTION PANEL
ALBUMIN: 3 g/dL — AB (ref 3.5–5.2)
ALT: 10 U/L (ref 0–35)
AST: 10 U/L (ref 0–37)
Alkaline Phosphatase: 65 U/L (ref 39–117)
Bilirubin, Direct: <0.2 mg/dL (ref 0.0–0.3)
Total Bilirubin: 0.5 mg/dL (ref 0.3–1.2)
Total Protein: 7.9 g/dL (ref 6.0–8.3)

## 2014-03-26 LAB — CBC WITH DIFFERENTIAL/PLATELET
Band Neutrophils: 14 % — ABNORMAL HIGH (ref 0–10)
Basophils Absolute: 0 10*3/uL (ref 0.0–0.1)
Basophils Relative: 0 % (ref 0–1)
Blasts: 0 %
Eosinophils Absolute: 0 10*3/uL (ref 0.0–0.7)
Eosinophils Relative: 0 % (ref 0–5)
HCT: 33.8 % — ABNORMAL LOW (ref 36.0–46.0)
Hemoglobin: 11.6 g/dL — ABNORMAL LOW (ref 12.0–15.0)
Lymphocytes Relative: 19 % (ref 12–46)
Lymphs Abs: 0.2 10*3/uL — ABNORMAL LOW (ref 0.7–4.0)
MCH: 27.6 pg (ref 26.0–34.0)
MCHC: 34.3 g/dL (ref 30.0–36.0)
MCV: 80.3 fL (ref 78.0–100.0)
Metamyelocytes Relative: 16 %
Monocytes Absolute: 0.1 10*3/uL (ref 0.1–1.0)
Monocytes Relative: 5 % (ref 3–12)
Myelocytes: 27 %
Neutro Abs: 1 10*3/uL — ABNORMAL LOW (ref 1.7–7.7)
Neutrophils Relative %: 13 % — ABNORMAL LOW (ref 43–77)
Platelets: 318 10*3/uL (ref 150–400)
Promyelocytes Absolute: 6 %
RBC: 4.21 MIL/uL (ref 3.87–5.11)
RDW: 16 % — ABNORMAL HIGH (ref 11.5–15.5)
WBC: 1.3 10*3/uL — CL (ref 4.0–10.5)
nRBC: 0 /100 WBC

## 2014-03-26 LAB — BASIC METABOLIC PANEL
BUN: 93 mg/dL — ABNORMAL HIGH (ref 6–23)
CO2: 14 mEq/L — ABNORMAL LOW (ref 19–32)
Calcium: 8.4 mg/dL (ref 8.4–10.5)
Chloride: 100 mEq/L (ref 96–112)
Creatinine, Ser: 1.43 mg/dL — ABNORMAL HIGH (ref 0.50–1.10)
GFR calc Af Amer: 36 mL/min — ABNORMAL LOW (ref >90)
GFR calc non Af Amer: 31 mL/min — ABNORMAL LOW (ref >90)
Glucose, Bld: 256 mg/dL — ABNORMAL HIGH (ref 70–99)
Potassium: 4.7 mEq/L (ref 3.7–5.3)
Sodium: 136 mEq/L — ABNORMAL LOW (ref 137–147)

## 2014-03-26 LAB — PHOSPHORUS: Phosphorus: 3.9 mg/dL (ref 2.3–4.6)

## 2014-03-26 LAB — URINALYSIS, ROUTINE W REFLEX MICROSCOPIC
Glucose, UA: NEGATIVE mg/dL
Ketones, ur: NEGATIVE mg/dL
Leukocytes, UA: NEGATIVE
Nitrite: NEGATIVE
Protein, ur: 100 mg/dL — AB
Specific Gravity, Urine: >1.03 — ABNORMAL HIGH (ref 1.005–1.030)
Urobilinogen, UA: 0.2 mg/dL (ref 0.0–1.0)
pH: 6 (ref 5.0–8.0)

## 2014-03-26 LAB — LACTIC ACID, PLASMA: Lactic Acid, Venous: 2 mmol/L (ref 0.5–2.2)

## 2014-03-26 LAB — URINE MICROSCOPIC-ADD ON

## 2014-03-26 LAB — PROTIME-INR
INR: 1.33 (ref 0.00–1.49)
Prothrombin Time: 16.2 seconds — ABNORMAL HIGH (ref 11.6–15.2)

## 2014-03-26 LAB — MAGNESIUM: Magnesium: 1.8 mg/dL (ref 1.5–2.5)

## 2014-03-26 LAB — GLUCOSE, CAPILLARY: GLUCOSE-CAPILLARY: 220 mg/dL — AB (ref 70–99)

## 2014-03-26 LAB — APTT: APTT: 40 s — AB (ref 24–37)

## 2014-03-26 MED ORDER — ACETAMINOPHEN 650 MG RE SUPP
RECTAL | Status: AC
  Administered 2014-03-26: 650 mg via RECTAL
  Filled 2014-03-26: qty 1

## 2014-03-26 MED ORDER — LORATADINE 10 MG PO TABS
10.0000 mg | ORAL_TABLET | Freq: Every day | ORAL | Status: DC
  Administered 2014-03-26: 10 mg via ORAL
  Filled 2014-03-26 (×2): qty 1

## 2014-03-26 MED ORDER — HYDROCODONE-ACETAMINOPHEN 5-325 MG PO TABS: 1.0000 | ORAL_TABLET | Freq: Four times a day (QID) | ORAL | Status: DC | PRN

## 2014-03-26 MED ORDER — ACETAMINOPHEN 650 MG RE SUPP
650.0000 mg | Freq: Once | RECTAL | Status: AC
  Administered 2014-03-26: 650 mg via RECTAL

## 2014-03-26 MED ORDER — VANCOMYCIN HCL 10 G IV SOLR
1500.0000 mg | Freq: Once | INTRAVENOUS | Status: AC
  Administered 2014-03-26: 1500 mg via INTRAVENOUS
  Filled 2014-03-26: qty 1500

## 2014-03-26 MED ORDER — PIPERACILLIN-TAZOBACTAM 3.375 G IVPB 30 MIN
3.3750 g | Freq: Once | INTRAVENOUS | Status: AC
  Administered 2014-03-26: 3.375 g via INTRAVENOUS
  Filled 2014-03-26 (×2): qty 50

## 2014-03-26 MED ORDER — INSULIN ASPART 100 UNIT/ML ~~LOC~~ SOLN
0.0000 [IU] | Freq: Three times a day (TID) | SUBCUTANEOUS | Status: DC
  Administered 2014-03-27: 5 [IU] via SUBCUTANEOUS
  Administered 2014-03-27: 3 [IU] via SUBCUTANEOUS
  Administered 2014-03-27: 5 [IU] via SUBCUTANEOUS

## 2014-03-26 MED ORDER — HYDROCORTISONE 1 % EX OINT
1.0000 "application " | TOPICAL_OINTMENT | Freq: Two times a day (BID) | CUTANEOUS | Status: DC
  Administered 2014-03-27: 1 via TOPICAL
  Filled 2014-03-26 (×2): qty 28.35

## 2014-03-26 MED ORDER — SODIUM CHLORIDE 0.9 % IV SOLN: INTRAVENOUS | Status: AC

## 2014-03-26 MED ORDER — SODIUM CHLORIDE 0.9 % IV SOLN: INTRAVENOUS | Status: DC

## 2014-03-26 MED ORDER — ACETAMINOPHEN 500 MG PO TABS
1000.0000 mg | ORAL_TABLET | Freq: Once | ORAL | Status: DC
  Filled 2014-03-26: qty 2

## 2014-03-26 MED ORDER — MORPHINE SULFATE 2 MG/ML IJ SOLN
1.0000 mg | INTRAMUSCULAR | Status: DC | PRN
  Administered 2014-03-27 (×2): 1 mg via INTRAVENOUS
  Filled 2014-03-26 (×2): qty 1

## 2014-03-26 MED ORDER — LOPERAMIDE HCL 2 MG PO CAPS: 2.0000 mg | ORAL_CAPSULE | ORAL | Status: DC | PRN

## 2014-03-26 MED ORDER — ACETAMINOPHEN 650 MG RE SUPP
650.0000 mg | Freq: Four times a day (QID) | RECTAL | Status: DC | PRN
  Administered 2014-03-27: 650 mg via RECTAL
  Filled 2014-03-26 (×2): qty 1

## 2014-03-26 MED ORDER — PROCHLORPERAZINE MALEATE 5 MG PO TABS: 10.0000 mg | ORAL_TABLET | Freq: Four times a day (QID) | ORAL | Status: DC | PRN

## 2014-03-26 MED ORDER — PIPERACILLIN-TAZOBACTAM 3.375 G IVPB
3.3750 g | Freq: Three times a day (TID) | INTRAVENOUS | Status: DC
  Administered 2014-03-27 (×3): 3.375 g via INTRAVENOUS
  Filled 2014-03-26 (×5): qty 50

## 2014-03-26 MED ORDER — SODIUM CHLORIDE 0.9 % IV BOLUS (SEPSIS)
1000.0000 mL | Freq: Once | INTRAVENOUS | Status: AC
  Administered 2014-03-26: 1000 mL via INTRAVENOUS

## 2014-03-26 MED ORDER — SIMVASTATIN 20 MG PO TABS
20.0000 mg | ORAL_TABLET | Freq: Every day | ORAL | Status: DC
  Administered 2014-03-26: 20 mg via ORAL
  Filled 2014-03-26 (×3): qty 1

## 2014-03-26 MED ORDER — ONDANSETRON HCL 4 MG PO TABS: 4.0000 mg | ORAL_TABLET | Freq: Four times a day (QID) | ORAL | Status: DC | PRN

## 2014-03-26 MED ORDER — ONDANSETRON HCL 4 MG/2ML IJ SOLN: 4.0000 mg | Freq: Four times a day (QID) | INTRAMUSCULAR | Status: DC | PRN

## 2014-03-26 MED ORDER — INSULIN GLARGINE 100 UNIT/ML ~~LOC~~ SOLN
15.0000 [IU] | Freq: Every day | SUBCUTANEOUS | Status: DC
Start: 2014-03-26 — End: 2014-03-27
  Administered 2014-03-26: 15 [IU] via SUBCUTANEOUS
  Filled 2014-03-26 (×2): qty 0.15

## 2014-03-26 MED ORDER — ZOLPIDEM TARTRATE 5 MG PO TABS: 5.0000 mg | ORAL_TABLET | Freq: Every day | ORAL | Status: DC

## 2014-03-26 MED ORDER — INSULIN ASPART 100 UNIT/ML ~~LOC~~ SOLN
0.0000 [IU] | Freq: Every day | SUBCUTANEOUS | Status: DC
  Administered 2014-03-26: 2 [IU] via SUBCUTANEOUS

## 2014-03-26 MED ORDER — VANCOMYCIN HCL 10 G IV SOLR
1250.0000 mg | INTRAVENOUS | Status: DC
  Filled 2014-03-26 (×2): qty 1250

## 2014-03-26 MED ORDER — CLOPIDOGREL BISULFATE 75 MG PO TABS
75.0000 mg | ORAL_TABLET | Freq: Every day | ORAL | Status: DC
  Filled 2014-03-26: qty 1

## 2014-03-26 MED ORDER — AMLODIPINE BESYLATE 5 MG PO TABS
5.0000 mg | ORAL_TABLET | Freq: Every day | ORAL | Status: DC
  Administered 2014-03-26: 5 mg via ORAL
  Filled 2014-03-26 (×2): qty 1

## 2014-03-26 MED ORDER — ACETAMINOPHEN 325 MG PO TABS: 650.0000 mg | ORAL_TABLET | Freq: Four times a day (QID) | ORAL | Status: DC | PRN

## 2014-03-26 MED ORDER — INSULIN GLARGINE 100 UNIT/ML SOLOSTAR PEN: 15.0000 [IU] | PEN_INJECTOR | Freq: Every day | SUBCUTANEOUS | Status: DC

## 2014-03-26 MED ORDER — SODIUM CHLORIDE 0.9 % IV SOLN
INTRAVENOUS | Status: DC
  Administered 2014-03-26: 18:00:00 via INTRAVENOUS

## 2014-03-26 MED ORDER — LEVOTHYROXINE SODIUM 112 MCG PO TABS
112.0000 ug | ORAL_TABLET | Freq: Every day | ORAL | Status: DC
  Filled 2014-03-26 (×4): qty 1

## 2014-03-26 NOTE — ED Notes (Signed)
Per EMS, pt has been progressively getting weaker since chemo treatment on Wednesday. Pt from [facility] and is being treated for breast cancer. Pt alert and oriented. Airway patent. EMS reports pt c/o of n/v and admin 4 mg of zofran. EMS reports CBG en route 287.

## 2014-03-26 NOTE — H&P (Signed)
[facility] History and Physical  April Myers HGD:[id number] DOB: [birth year] DOA: 04/01/14  Referring physician: ER physician PCP: Rosita Fire, MD   Chief Complaint: fever  HPI:  80 year old female with past medical history dementia, hyptertension, dyslipidemia, diabetes, breast cancer on chemotherapy (Taxol/Herceptin/Perjecta weekly, started on 03/08/2014), last therapy 03/16/2014 under Dr. Kennieth Francois care who presented to [facility] ED 04-01-14 with reports of progressive weakness and fatigue for past couple of days prior to this admission. She is not a good historian due to history of dementia so history obtained from ED. No respiratory distress, no vomiting, no diarrhea. No fevers. In ED, her BP was 101/73, HR 98 - 114, Tmax 100.3 F, oxygen saturation was 95% on room air. Her blood work revealed WBC count of 1.3, hemoglobin of 11.6, sodium mildly decreased at 136, new elevation in creatinine to 1.43 from baseline normal. Blood sugar was 256. She was started on empiric antibiotics, vanco and zosyn for neutropenic fever. UA and CXR did not reveal sign of an acute infection.  Assessment and Plan:  Principal Problem:   Neutropenic fever - secondary to chemotherapy (last chemo given 03/16/2014, cant see if she got neulasta) - will hold off on Neupogen unless further drop and will have to notify oncology in am of patient's admission - started vanco and zosyn empirically - UA and CXR did not reveal acute infection - follow up blood culture results Active Problems:   Breast cancer, left breast - on chemotherapy - please notify oncology in am of patient's admission   Insulin-requiring or dependent type II diabetes mellitus - continue Lantus and SSI - check A1c - hold metformin due to acute renal failure    Hypothyroidism - continue synthroid   Dyslipidemia - continue statin therapy    HTN (hypertension) - continue norvasc   Anemia of chronic disease - secondary to history of  breast cancer and sequela of chemotherapy - hemoglobin is 11.6  - no current indications for transfusion   Acute renal failure - likely due to dehydration and/or metformin - metformin on hold - continue IV fluids, NS@ 100 cc/hr for 12 hours and reassess in am if IV fluids still required - follow up BMP in am  Radiological Exams on Admission: Dg Chest 2 View 2014/04/01    IMPRESSION: There is no evidence of pneumonia. There is mild stable enlargement of the cardiac silhouette without pulmonary vascular congestion.      Code Status: Full Family Communication: Pt at bedside Disposition Plan: Admit for further evaluation  Robbie Lis, MD  [facility] Pager [phone]  Review of Systems:  Unable to obtain due to dementia.    Past Medical History  Diagnosis Date  . Coronary artery disease   . Arthritis   . Cancer   . Anemia   . Diabetes mellitus without complication   . Hypercholesteremia   . Hypertension   . Hypothyroidism   . Anxiety   . Dementia   . Poor historian   . Cataract of both eyes    Past Surgical History  Procedure Laterality Date  . Mastectomy Right 2006  . Portacath placement N/A 02/21/2014    Procedure: INSERTION POWER PORT-A-CATH RIGHT SUBCLAVIAN;  Surgeon: Jamesetta So, MD;  Location: [facility];  Service: General;  Laterality: N/A;   Social History:  reports that she has quit smoking. Her smoking use included Cigarettes. She smoked 0.00 packs per day. She does not have any smokeless tobacco history on file. She reports that  she does not drink alcohol or use illicit drugs.  No Known Allergies  Family History: unable to obtain history from pt due to altered mental status  Prior to Admission medications   Medication Sig Start Date End Date Taking? Authorizing Provider  amLODipine (NORVASC) 5 MG tablet Take 5 mg by mouth daily.   Yes Historical Provider, MD  clopidogrel (PLAVIX) 75 MG tablet Take 75 mg by mouth daily with breakfast.   Yes Historical  Provider, MD  hydrocortisone 1 % ointment Apply 1 application topically 2 (two) times daily.   Yes Historical Provider, MD  Insulin Glargine (LANTUS SOLOSTAR) 100 UNIT/ML Solostar Pen Inject 15 Units into the skin at bedtime.   Yes Historical Provider, MD  levothyroxine (SYNTHROID, LEVOTHROID) 112 MCG tablet Take 112 mcg by mouth daily.   Yes Historical Provider, MD  loratadine (CLARITIN) 10 MG tablet Take 10 mg by mouth daily.   Yes Historical Provider, MD  metFORMIN (GLUCOPHAGE) 500 MG tablet Take 500 mg by mouth 2 (two) times daily with a meal.   Yes Historical Provider, MD  simvastatin (ZOCOR) 20 MG tablet Take 20 mg by mouth at bedtime.   Yes Historical Provider, MD  zolpidem (AMBIEN) 5 MG tablet Take 5 mg by mouth daily.    Yes Historical Provider, MD  HYDROcodone-acetaminophen (NORCO/VICODIN) 5-325 MG per tablet  02/21/14 02/21/15  Jamesetta So, MD  lidocaine-prilocaine (EMLA) cream  03/01/14   Farrel Gobble, MD  loperamide (IMODIUM) 2 MG capsule . 03/01/14   Baird Cancer, PA-C  metoCLOPramide (REGLAN) 5 MG tablet  03/01/14   Farrel Gobble, MD  prochlorperazine (COMPAZINE) 10 MG tablet  03/01/14   Farrel Gobble, MD   Physical Exam: Filed Vitals:   03/31/2014 1437 03/25/2014 1459 03/27/2014 1600 03/22/2014 1645  BP: 131/86  101/73   Pulse: 114   98  Temp: 99.1 F (37.3 C) 100.3 F (37.9 C)    TempSrc: Oral Rectal    Resp:    26  Height: 5\' 7"  (1.702 m)     Weight: 89.359 kg (197 lb)     SpO2: 95%   100%    Physical Exam  Constitutional: Appears ill, no acute distress HENT: Normocephalic. No tonsillar erythema or exudates Eyes: Conjunctivae and EOM are normal. PERRLA, no scleral icterus.  Neck: Normal ROM. Neck supple. No JVD. No tracheal deviation. CVS: tachycardic, S1/S2 appreciated Pulmonary: diminished breath sounds, no wheezing   Abdominal: Soft. BS +,  no distension, tenderness, rebound or guarding.  Musculoskeletal: Normal range of motion. Notenderness   Lymphadenopathy: No lymphadenopathy noted, cervical, inguinal. Neuro: Alert. No focal neurologic deficits Skin: Skin is warm and dry.  Labs on Admission:  Basic Metabolic Panel:  Recent Labs Lab 03/25/2014 1521  NA 136*  K 4.7  CL 100  CO2 14*  GLUCOSE 256*  BUN 93*  CREATININE 1.43*  CALCIUM 8.4   Liver Function Tests: No results found for this basename: AST, ALT, ALKPHOS, BILITOT, PROT, ALBUMIN,  in the last 168 hours No results found for this basename: LIPASE, AMYLASE,  in the last 168 hours No results found for this basename: AMMONIA,  in the last 168 hours CBC:  Recent Labs Lab 03/23/14 1008 04/07/2014 1521  WBC 2.4* 1.3*  NEUTROABS 1.5* 1.0*  HGB 10.8* 11.6*  HCT 32.2* 33.8*  MCV 83.0 80.3  PLT 388 318   Cardiac Enzymes: No results found for this basename: CKTOTAL, CKMB, CKMBINDEX, TROPONINI,  in the last 168 hours BNP: No components found  with this basename: POCBNP,  CBG: No results found for this basename: GLUCAP,  in the last 168 hours  If 7PM-7AM, please contact night-coverage [email or web address] Password [facility] 03/29/2014, 5:09 PM

## 2014-03-26 NOTE — ED Provider Notes (Signed)
CSN: [id number]     Arrival date & time 23-Apr-2014  1435 History   First MD Initiated Contact with Patient 23-Apr-2014 1502    This chart was scribed for April Manifold, MD by Terressa Koyanagi, ED Scribe. This patient was seen in room [facility]14[facility] and the patient's care was started at 3:07 PM.  PCP: Rosita Fire, MD  LEVEL 5 CAVEAT  Chief Complaint  Patient presents with  . Weakness   The history is provided by the patient and the EMS personnel. No language interpreter was used.    HPI Comments: April Myers is a 80 y.o. female, with a history of dementia, CAD, CA (pt is currently undergoing treatment for breast cancer), and DM,  brought in by ambulance, who presents to the Emergency Department complaining of constant, worsening weakness onset 3 days ago. Pt denies diarrhea, dysuria, urinary retention, urinary frequency, recent falls, or abd pain. Reliability of her answers though is questionable. She does answer basic questions but very slowly and not convinced she truly understands everything I am asking her. No reported trauma.    Past Medical History  Diagnosis Date  . Coronary artery disease   . Arthritis   . Cancer   . Anemia   . Diabetes mellitus without complication   . Hypercholesteremia   . Hypertension   . Hypothyroidism   . Anxiety   . Dementia   . Poor historian   . Cataract of both eyes    Past Surgical History  Procedure Laterality Date  . Mastectomy Right 2006  . Portacath placement N/A 02/21/2014    Procedure: INSERTION POWER PORT-A-CATH RIGHT SUBCLAVIAN;  Surgeon: Jamesetta So, MD;  Location: [facility];  Service: General;  Laterality: N/A;   History reviewed. No pertinent family history. History  Substance Use Topics  . Smoking status: Former Smoker    Types: Cigarettes  . Smokeless tobacco: Not on file  . Alcohol Use: No   OB History   Grav Para Term Preterm Abortions TAB SAB Ect Mult Living                 Review of Systems  Unable to perform ROS:  Dementia  Neurological: Positive for weakness (generalized weakness).  All other systems reviewed and are negative.  Allergies  Review of patient's allergies indicates no known allergies.  Home Medications   Current Outpatient Rx  Name  Route  Sig  Dispense  Refill  . amLODipine (NORVASC) 5 MG tablet   Oral   Take 5 mg by mouth daily.         . clopidogrel (PLAVIX) 75 MG tablet   Oral   Take 75 mg by mouth daily with breakfast.         . HYDROcodone-acetaminophen (NORCO/VICODIN) 5-325 MG per tablet   Oral   Take 1 tablet by mouth every 6 (six) hours as needed for moderate pain.   20 tablet   0   . hydrocortisone 1 % ointment   Topical   Apply 1 application topically 2 (two) times daily.         . insulin glargine (LANTUS) 100 UNIT/ML injection   Subcutaneous   Inject 15 Units into the skin at bedtime.         Marland Kitchen levothyroxine (SYNTHROID, LEVOTHROID) 112 MCG tablet   Oral   Take 112 mcg by mouth daily.         Marland Kitchen lidocaine-prilocaine (EMLA) cream      Apply a quarter size amount to  port site 1 hour prior to chemo. Do not rub in. Cover with plastic wrap.   30 g   3   . loperamide (IMODIUM) 2 MG capsule      Take 2 capsules after the 1st loose stool, and then 1 capsule every 4 hours if needed for loose stools. Not to exceed 8 capsules in a 24 hour period.   30 capsule   0   . loratadine (CLARITIN) 10 MG tablet   Oral   Take 10 mg by mouth daily.         . metFORMIN (GLUCOPHAGE) 500 MG tablet   Oral   Take 500 mg by mouth 2 (two) times daily with a meal.         . metoCLOPramide (REGLAN) 5 MG tablet      Starting the day after chemo, take 1 tablet four times a day x 48 hours. Then may take 1 tablet four times a day if needed for nausea/vomiting.   60 tablet   2   . prochlorperazine (COMPAZINE) 10 MG tablet      Starting the day after chemo, take 1 tablet four times a day x 48 hours. Then may take 1 tablet four times a day if needed for  nausea/vomiting.   60 tablet   2   . simvastatin (ZOCOR) 20 MG tablet   Oral   Take 20 mg by mouth at bedtime.         Marland Kitchen zolpidem (AMBIEN) 5 MG tablet   Oral   Take 5 mg by mouth once.          Triage Vitals: BP 131/86  Pulse 114  Temp(Src) 100.3 F (37.9 C) (Rectal)  Ht 5\' 7"  (1.702 m)  Wt 197 lb (89.359 kg)  BMI 30.85 kg/m2  SpO2 95% Physical Exam  Nursing note and vitals reviewed. Constitutional: She appears well-developed and well-nourished.  Tired appearing   HENT:  Head: Normocephalic and atraumatic.  Eyes: Conjunctivae and EOM are normal. Pupils are equal, round, and reactive to light.  Neck: Normal range of motion. Neck supple.  Cardiovascular: Normal rate, regular rhythm and normal heart sounds.   Tachycardic   Pulmonary/Chest: Effort normal and breath sounds normal.  Abdominal: Soft. Bowel sounds are normal. There is tenderness (lower abd). There is no rebound and no guarding.  Healed midline upper abdominal incision. Mild lower abdominal tenderness. No rebound/guarding. No distension.   Musculoskeletal: Normal range of motion.  Neurological:  Drowsy, but opens eyes to voice. Globally weak. No focal deficit noted.   Skin: Skin is warm and dry.    ED Course  Procedures (including critical care time)  CRITICAL CARE Performed by: April Myers Total critical care time: 35 minutes Critical care time was exclusive of separately billable procedures and treating other patients. Critical care was necessary to treat or prevent imminent or life-threatening deterioration. Critical care was time spent personally by me on the following activities: development of treatment plan with patient and/or surrogate as well as nursing, discussions with consultants, evaluation of patient's response to treatment, examination of patient, obtaining history from patient or surrogate, ordering and performing treatments and interventions, ordering and review of laboratory studies,  ordering and review of radiographic studies, pulse oximetry and re-evaluation of patient's condition. DIAGNOSTIC STUDIES: Oxygen Saturation is 95% on RA, adequate by my interpretation.    COORDINATION OF CARE: 3:12 PM-Discussed treatment plan which includes UA,  EKG, possible admittance to the hospital, and labs with pt at bedside  and pt agreed to plan.   Labs Review Labs Reviewed  CBC WITH DIFFERENTIAL - Abnormal; Notable for the following:    WBC 1.3 (*)    Hemoglobin 11.6 (*)    HCT 33.8 (*)    RDW 16.0 (*)    Neutrophils Relative % 13 (*)    Band Neutrophils 14 (*)    Neutro Abs 1.0 (*)    Lymphs Abs 0.2 (*)    All other components within normal limits  BASIC METABOLIC PANEL - Abnormal; Notable for the following:    Sodium 136 (*)    CO2 14 (*)    Glucose, Bld 256 (*)    BUN 93 (*)    Creatinine, Ser 1.43 (*)    GFR calc non Af Amer 31 (*)    GFR calc Af Amer 36 (*)    All other components within normal limits  URINALYSIS, ROUTINE W REFLEX MICROSCOPIC - Abnormal; Notable for the following:    APPearance HAZY (*)    Specific Gravity, Urine >1.030 (*)    Hgb urine dipstick SMALL (*)    Bilirubin Urine SMALL (*)    Protein, ur 100 (*)    All other components within normal limits  URINE MICROSCOPIC-ADD ON - Abnormal; Notable for the following:    Bacteria, UA FEW (*)    All other components within normal limits  CULTURE, BLOOD (ROUTINE X 2)  CULTURE, BLOOD (ROUTINE X 2)  LACTIC ACID, PLASMA   Imaging Review Dg Chest 2 View  04/06/2014   CLINICAL DATA:  Fever and weakness and confusion  EXAM: CHEST  2 VIEW  COMPARISON:  DG CHEST 2 VIEW dated 03/03/2014  FINDINGS: The lungs are adequately inflated. There is no focal infiltrate. The cardiopericardial silhouette is mildly enlarged. The pulmonary vascularity is not engorged. There is no pleural effusion. The Port-A-Cath appliance tip lies in the region of the midportion of the SVC. There are surgical clips at the base of the  neck.  IMPRESSION: There is no evidence of pneumonia. There is mild stable enlargement of the cardiac silhouette without pulmonary vascular congestion.   Electronically Signed   By: David  Martinique   On: 04/04/2014 16:15     EKG Interpretation None      MDM   Final diagnoses:  Neutropenic fever  Dehydration  History of breast cancer    89yf with neutropenic fever. Hx of breast CA on chemo. No clear source from initial w/u and exam. Empiric abx. Cultures. AKI. Markedly elevated BUN, high specific gravity, etc consistent with dehydration. IVF. Admission.   I personally preformed the services scribed in my presence. The recorded information has been reviewed is accurate. April Manifold, MD.    April Manifold, MD 03/31/14 [phone]

## 2014-03-26 NOTE — Progress Notes (Signed)
ANTIBIOTIC CONSULT NOTE - INITIAL  Pharmacy Consult for Vancomycin & Zosyn Indication: Neutropenic fever  No Known Allergies  Patient Measurements: Height: 5\' 7"  (170.2 cm) Weight: 197 lb (89.359 kg) IBW/kg (Calculated) : 61.6 Adjusted Body Weight:   Vital Signs: Temp: 100.3 F (37.9 C) (04/11 1459) Temp src: Rectal (04/11 1459) BP: 131/86 mmHg (04/11 1437) Pulse Rate: 114 (04/11 1437) Intake/Output from previous day:   Intake/Output from this shift:    Labs:  Recent Labs  19-Apr-2014 1521  WBC 1.3*  HGB 11.6*  PLT 318  CREATININE 1.43*   Estimated Creatinine Clearance: 30 ml/min (by C-G formula based on Cr of 1.43). No results found for this basename: VANCOTROUGH, VANCOPEAK, VANCORANDOM, GENTTROUGH, GENTPEAK, GENTRANDOM, TOBRATROUGH, TOBRAPEAK, TOBRARND, AMIKACINPEAK, AMIKACINTROU, AMIKACIN,  in the last 72 hours   Microbiology: No results found for this or any previous visit (from the past 720 hour(s)).  Medical History: Past Medical History  Diagnosis Date  . Coronary artery disease   . Arthritis   . Cancer   . Anemia   . Diabetes mellitus without complication   . Hypercholesteremia   . Hypertension   . Hypothyroidism   . Anxiety   . Dementia   . Poor historian   . Cataract of both eyes     Medications:  Scheduled:   Assessment: Neutropenic fever, breast cancer patient, treatment on Wednesday Reduced renal function  Goal of Therapy:  Vancomycin trough level 15-20 mcg/ml  Plan:  Vancomycin 1500 mg IV loading dose, then 1250 mg IV every 24 hours Zosyn 3.375 GM IV every 8 hours Vancomycin trough at steady state Monitor renal function Labs per protocol  Rameen Quinney Lenoard Aden Apr 19, 2014,4:28 PM

## 2014-03-26 NOTE — ED Notes (Signed)
CRITICAL VALUE ALERT  Critical value received:  WBC 1.3  Date of notification:  03/17/2014  Time of notification:  1609  Critical value read back:yes  Nurse who received alert:  Parthenia Ames  MD notified (1st page):  Dr. Wilson Singer  Time of first page:  1609  MD notified (2nd page):  Time of second page:  Responding MD:  Dr. Wilson Singer  Time MD responded:  [phone]

## 2014-03-27 ENCOUNTER — Inpatient Hospital Stay (HOSPITAL_COMMUNITY): Payer: Medicare Other

## 2014-03-27 ENCOUNTER — Inpatient Hospital Stay (HOSPITAL_COMMUNITY): Payer: Medicare Other | Admitting: Anesthesiology

## 2014-03-27 ENCOUNTER — Encounter (HOSPITAL_COMMUNITY): Payer: Medicare Other | Admitting: Anesthesiology

## 2014-03-27 DIAGNOSIS — E872 Acidosis, unspecified: Secondary | ICD-10-CM

## 2014-03-27 DIAGNOSIS — A419 Sepsis, unspecified organism: Secondary | ICD-10-CM

## 2014-03-27 LAB — BLOOD GAS, ARTERIAL
Acid-Base Excess: 13.1 mmol/L — ABNORMAL HIGH (ref 0.0–2.0)
Acid-base deficit: 11.2 mmol/L — ABNORMAL HIGH (ref 0.0–2.0)
Acid-base deficit: 13.3 mmol/L — ABNORMAL HIGH (ref 0.0–2.0)
Bicarbonate: 12.8 mEq/L — ABNORMAL LOW (ref 20.0–24.0)
Bicarbonate: 13 mEq/L — ABNORMAL LOW (ref 20.0–24.0)
DRAWN BY: 317771
FIO2: 21 %
FIO2: 1 %
LHR: 14 {breaths}/min
O2 SAT: 95.5 %
O2 Saturation: 94.2 %
PCO2 ART: 32.7 mmHg — AB (ref 35.0–45.0)
PEEP: 5 cmH2O
PO2 ART: 88 mmHg (ref 80.0–100.0)
Patient temperature: 37
TCO2: 11.7 mmol/L (ref 0–100)
TCO2: 12.5 mmol/L (ref 0–100)
VT: 500 mL
pCO2 arterial: 21.3 mmHg — ABNORMAL LOW (ref 35.0–45.0)
pH, Arterial: 7.395 (ref 7.350–7.450)
pH, Arterial: 7.225 — ABNORMAL LOW (ref 7.350–7.450)
pO2, Arterial: 79.5 mmHg — ABNORMAL LOW (ref 80.0–100.0)

## 2014-03-27 LAB — HEMOGLOBIN A1C
HEMOGLOBIN A1C: 6.9 % — AB (ref <5.7)
Mean Plasma Glucose: 151 mg/dL — ABNORMAL HIGH (ref <117)

## 2014-03-27 LAB — CBC
HCT: 32.3 % — ABNORMAL LOW (ref 36.0–46.0)
Hemoglobin: 11.1 g/dL — ABNORMAL LOW (ref 12.0–15.0)
MCH: 27.5 pg (ref 26.0–34.0)
MCHC: 34.4 g/dL (ref 30.0–36.0)
MCV: 80 fL (ref 78.0–100.0)
PLATELETS: 267 10*3/uL (ref 150–400)
RBC: 4.04 MIL/uL (ref 3.87–5.11)
RDW: 16.1 % — ABNORMAL HIGH (ref 11.5–15.5)
WBC: 0.9 10*3/uL — AB (ref 4.0–10.5)

## 2014-03-27 LAB — COMPREHENSIVE METABOLIC PANEL
ALBUMIN: 2.7 g/dL — AB (ref 3.5–5.2)
ALT: 10 U/L (ref 0–35)
AST: 16 U/L (ref 0–37)
Alkaline Phosphatase: 58 U/L (ref 39–117)
BILIRUBIN TOTAL: 0.6 mg/dL (ref 0.3–1.2)
BUN: 92 mg/dL — ABNORMAL HIGH (ref 6–23)
CALCIUM: 7.9 mg/dL — AB (ref 8.4–10.5)
CO2: 15 mEq/L — ABNORMAL LOW (ref 19–32)
CREATININE: 2.02 mg/dL — AB (ref 0.50–1.10)
Chloride: 104 mEq/L (ref 96–112)
GFR calc Af Amer: 24 mL/min — ABNORMAL LOW (ref >90)
GFR calc non Af Amer: 21 mL/min — ABNORMAL LOW (ref >90)
Glucose, Bld: 241 mg/dL — ABNORMAL HIGH (ref 70–99)
Potassium: 4.9 mEq/L (ref 3.7–5.3)
Sodium: 139 mEq/L (ref 137–147)
Total Protein: 7.2 g/dL (ref 6.0–8.3)

## 2014-03-27 LAB — BASIC METABOLIC PANEL
BUN: 99 mg/dL — ABNORMAL HIGH (ref 6–23)
CHLORIDE: 108 meq/L (ref 96–112)
CO2: 15 mEq/L — ABNORMAL LOW (ref 19–32)
Calcium: 6.9 mg/dL — ABNORMAL LOW (ref 8.4–10.5)
Creatinine, Ser: 3.33 mg/dL — ABNORMAL HIGH (ref 0.50–1.10)
GFR, EST AFRICAN AMERICAN: 13 mL/min — AB (ref >90)
GFR, EST NON AFRICAN AMERICAN: 11 mL/min — AB (ref >90)
Glucose, Bld: 194 mg/dL — ABNORMAL HIGH (ref 70–99)
POTASSIUM: 4.6 meq/L (ref 3.7–5.3)
SODIUM: 145 meq/L (ref 137–147)

## 2014-03-27 LAB — GLUCOSE, CAPILLARY
GLUCOSE-CAPILLARY: 210 mg/dL — AB (ref 70–99)
GLUCOSE-CAPILLARY: 166 mg/dL — AB (ref 70–99)
Glucose-Capillary: 203 mg/dL — ABNORMAL HIGH (ref 70–99)
Glucose-Capillary: 148 mg/dL — ABNORMAL HIGH (ref 70–99)

## 2014-03-27 LAB — URINE CULTURE
Colony Count: NO GROWTH
Culture: NO GROWTH

## 2014-03-27 LAB — MRSA PCR SCREENING: MRSA by PCR: NEGATIVE

## 2014-03-27 MED ORDER — ALBUTEROL SULFATE (2.5 MG/3ML) 0.083% IN NEBU
2.5000 mg | INHALATION_SOLUTION | RESPIRATORY_TRACT | Status: DC
  Administered 2014-03-27 (×2): 2.5 mg via RESPIRATORY_TRACT
  Filled 2014-03-27 (×2): qty 3

## 2014-03-27 MED ORDER — PIPERACILLIN-TAZOBACTAM 3.375 G IVPB
INTRAVENOUS | Status: AC
  Filled 2014-03-27: qty 50

## 2014-03-27 MED ORDER — CHLORHEXIDINE GLUCONATE 0.12 % MT SOLN
15.0000 mL | Freq: Two times a day (BID) | OROMUCOSAL | Status: DC
  Administered 2014-03-27: 15 mL via OROMUCOSAL
  Filled 2014-03-27: qty 15

## 2014-03-27 MED ORDER — FENTANYL CITRATE 0.05 MG/ML IJ SOLN
50.0000 ug | INTRAMUSCULAR | Status: DC | PRN
  Administered 2014-03-27 (×2): 50 ug via INTRAVENOUS

## 2014-03-27 MED ORDER — VANCOMYCIN HCL IN DEXTROSE 1-5 GM/200ML-% IV SOLN
1000.0000 mg | INTRAVENOUS | Status: DC
  Administered 2014-03-27: 1000 mg via INTRAVENOUS
  Filled 2014-03-27 (×2): qty 200

## 2014-03-27 MED ORDER — FENTANYL CITRATE 0.05 MG/ML IJ SOLN
INTRAMUSCULAR | Status: AC
  Filled 2014-03-27: qty 2

## 2014-03-27 MED ORDER — SUCCINYLCHOLINE CHLORIDE 20 MG/ML IJ SOLN
INTRAMUSCULAR | Status: AC
  Filled 2014-03-27: qty 1

## 2014-03-27 MED ORDER — ETOMIDATE 2 MG/ML IV SOLN
INTRAVENOUS | Status: DC | PRN
  Administered 2014-03-27: 10 mg via INTRAVENOUS

## 2014-03-27 MED ORDER — SODIUM BICARBONATE 8.4 % IV SOLN
100.0000 meq | Freq: Once | INTRAVENOUS | Status: AC
  Administered 2014-03-27: 100 meq via INTRAVENOUS
  Filled 2014-03-27: qty 100

## 2014-03-27 MED ORDER — FENTANYL CITRATE 0.05 MG/ML IJ SOLN
50.0000 ug | Freq: Once | INTRAMUSCULAR | Status: AC
  Filled 2014-03-27: qty 2

## 2014-03-27 MED ORDER — SODIUM CHLORIDE 0.9 % IV BOLUS (SEPSIS)
1000.0000 mL | INTRAVENOUS | Status: AC
  Administered 2014-03-27: 1000 mL via INTRAVENOUS

## 2014-03-27 MED ORDER — FAMOTIDINE IN NACL 20-0.9 MG/50ML-% IV SOLN
20.0000 mg | INTRAVENOUS | Status: DC
  Administered 2014-03-27: 20 mg via INTRAVENOUS
  Filled 2014-03-27: qty 50

## 2014-03-27 MED ORDER — BIOTENE DRY MOUTH MT LIQD
15.0000 mL | Freq: Four times a day (QID) | OROMUCOSAL | Status: DC
  Administered 2014-03-28: 15 mL via OROMUCOSAL

## 2014-03-27 MED ORDER — DEXTROSE 5 % IV SOLN
2.0000 g | INTRAVENOUS | Status: DC
  Administered 2014-03-27: 2 g via INTRAVENOUS
  Filled 2014-03-27: qty 2

## 2014-03-27 MED ORDER — IOHEXOL 300 MG/ML  SOLN: 100.0000 mL | Freq: Once | INTRAMUSCULAR | Status: AC | PRN

## 2014-03-27 MED ORDER — SODIUM CHLORIDE 0.9 % IV SOLN
0.0000 ug/h | INTRAVENOUS | Status: DC
  Administered 2014-03-27: 50 ug/h via INTRAVENOUS
  Filled 2014-03-27: qty 50

## 2014-03-27 MED ORDER — SODIUM BICARBONATE 8.4 % IV SOLN
INTRAVENOUS | Status: AC
  Filled 2014-03-27: qty 100

## 2014-03-27 MED ORDER — STERILE WATER FOR INJECTION IV SOLN
INTRAVENOUS | Status: DC
  Filled 2014-03-27 (×2): qty 9.7

## 2014-03-27 MED ORDER — FENTANYL BOLUS VIA INFUSION
25.0000 ug | INTRAVENOUS | Status: DC | PRN
  Filled 2014-03-27: qty 50

## 2014-03-27 MED ORDER — SODIUM BICARBONATE 8.4 % IV SOLN
INTRAVENOUS | Status: DC
  Administered 2014-03-27: 20:00:00 via INTRAVENOUS
  Filled 2014-03-27 (×2): qty 100

## 2014-03-27 MED ORDER — FENTANYL CITRATE 0.05 MG/ML IJ SOLN
INTRAMUSCULAR | Status: AC
  Filled 2014-03-27: qty 50

## 2014-03-27 MED ORDER — SUCCINYLCHOLINE CHLORIDE 20 MG/ML IJ SOLN
INTRAMUSCULAR | Status: DC | PRN
  Administered 2014-03-27: 100 mg via INTRAVENOUS

## 2014-03-27 MED ORDER — CEFEPIME HCL 2 G IJ SOLR
INTRAMUSCULAR | Status: AC
  Filled 2014-03-27: qty 2

## 2014-03-27 MED ORDER — ETOMIDATE 2 MG/ML IV SOLN
INTRAVENOUS | Status: AC
  Filled 2014-03-27: qty 10

## 2014-03-27 MED ORDER — FAMOTIDINE IN NACL 20-0.9 MG/50ML-% IV SOLN
INTRAVENOUS | Status: AC
  Filled 2014-03-27: qty 50

## 2014-03-27 MED ORDER — SODIUM CHLORIDE 0.9 % IV BOLUS (SEPSIS)
1000.0000 mL | Freq: Once | INTRAVENOUS | Status: AC
  Administered 2014-03-27: 1000 mL via INTRAVENOUS

## 2014-03-27 MED ORDER — TBO-FILGRASTIM 300 MCG/0.5ML ~~LOC~~ SOSY
300.0000 ug | PREFILLED_SYRINGE | Freq: Once | SUBCUTANEOUS | Status: AC
  Administered 2014-03-27: 300 ug via SUBCUTANEOUS
  Filled 2014-03-27: qty 0.5

## 2014-03-27 NOTE — Progress Notes (Signed)
Critical lab WBC 0.9 Dr.Hawkins notified. Patient put on neutropenic precautions.

## 2014-03-27 NOTE — Progress Notes (Addendum)
She has been moved to the intensive care unit but has continued deterioration. She continues with high respiratory rate but is somewhat more responsive than when I saw her earlier this morning at the request of her bedside nurse until Dr. Legrand Rams could see her. She is able to respond some to questions. Her chest x-ray shows what appears to be a retrocardiac infiltrate consistent with pneumonia. She's had more fever and has had more blood cultures done. She has been on BiPAP but continues to be tachypneic. Her blood pressure has been running in the 90s mostly with occasional dips.  Because she is a "ward of the state" she has rocking him can be social services as her power of attorney. I have discussed her situation with Melissa who is on-call for social services. I requested consultation from Thurston to see what he felt about CODE STATUS etc. He and I both agree that it is reasonable at this point to intubate her but not to plan CPR considering her advanced age. I discussed her situation with Dr. Karolee Ohs with infectious disease and it is recommended that we switch her from Zosyn to cefepime and I have asked for pharmacy to calculate that dose. She has very low bicarbonate level and although her last blood gas showed that her pH was okay I think she may be tachypnea from trying to compensate her low bicarbonate. I have asked for her to get 2 amps of sodium bicarbonate and then start on a sodium bicarbonate drip. She is going to receive a 1 L bolus of IV fluids. She will have ventilator orders with blood gas in an hour. She will have another basic metabolic profile. She will have repeat laboratory work in the morning. If she requires pressors that will be given. Her prognosis is poor. She will be given Neupogen for her white blood count.she has also developed atrial fib but V response ok so far.  critical care time 1 hour

## 2014-03-27 NOTE — Progress Notes (Signed)
Patient with orders to be transferred to stepdown. Report called to Mikki Santee, Therapist, sports. Patient transferred to ICU 8.

## 2014-03-27 NOTE — Progress Notes (Addendum)
Observed patient with increase confusion, speech slurred, labored breathing, elevated pulse 115. Lungs diminished. O2 sats 96%, no fever at this time. Patient had one episode of vomiting and diarrhea. Patient is unable to swallow, does not follow commands, no PO meds given. No family to contact. Dr. Luan Pulling notified, states he will be in to assess patient.

## 2014-03-27 NOTE — Progress Notes (Signed)
ANTIBIOTIC CONSULT NOTE - INITIAL  Pharmacy Consult for Cefepime Indication: Neutropenic Fever  No Known Allergies  Patient Measurements: Height: 5\' 6"  (167.6 cm) Weight: 202 lb 13.2 oz (92 kg) IBW/kg (Calculated) : 59.3 Adjusted Body Weight:   Vital Signs:  Intake/Output from previous day: 04/11 0701 - 04/12 0700 In: -  Out: 1 [Stool:1] Intake/Output from this shift: Total I/O In: 300 [IV Piggyback:300] Out: -   Labs:  Recent Labs  Apr 21, 2014 1521 03/27/14 0547  WBC 1.3* 0.9*  HGB 11.6* 11.1*  PLT 318 267  CREATININE 1.43* 2.02*   Estimated Creatinine Clearance: 21.2 ml/min (by C-G formula based on Cr of 2.02). No results found for this basename: Letta Median, VANCORANDOM, GENTTROUGH, GENTPEAK, GENTRANDOM, TOBRATROUGH, TOBRAPEAK, TOBRARND, AMIKACINPEAK, AMIKACINTROU, AMIKACIN,  in the last 72 hours   Microbiology: Recent Results (from the past 720 hour(s))  URINE CULTURE     Status: None   Collection Time    04-21-2014  3:45 PM      Result Value Ref Range Status   Specimen Description URINE, CATHETERIZED   Final   Special Requests NONE   Final   Culture  Setup Time     Final   Value: 04/21/14 19:56     Performed at [place]     Final   Value: NO GROWTH     Performed at [facility]   Culture     Final   Value: NO GROWTH     Performed at [facility]   Report Status 03/27/2014 FINAL   Final  CULTURE, BLOOD (ROUTINE X 2)     Status: None   Collection Time    04/21/2014  4:30 PM      Result Value Ref Range Status   Specimen Description LEFT ANTECUBITAL   Final   Special Requests BOTTLES DRAWN AEROBIC AND ANAEROBIC 6CC   Final   Culture NO GROWTH 1 DAY   Final   Report Status PENDING   Incomplete  CULTURE, BLOOD (ROUTINE X 2)     Status: None   Collection Time    Apr 21, 2014  4:33 PM      Result Value Ref Range Status   Specimen Description PORTA CATH   Final   Special Requests BOTTLES DRAWN AEROBIC AND  ANAEROBIC [place]   Final   Culture NO GROWTH 1 DAY   Final   Report Status PENDING   Incomplete  MRSA PCR SCREENING     Status: None   Collection Time    03/27/14 11:30 AM      Result Value Ref Range Status   MRSA by PCR NEGATIVE  NEGATIVE Final   Comment:            The GeneXpert MRSA Assay (FDA     approved for NASAL specimens     only), is one component of a     comprehensive MRSA colonization     surveillance program. It is not     intended to diagnose MRSA     infection nor to guide or     monitor treatment for     MRSA infections.    Medical History: Past Medical History  Diagnosis Date  . Coronary artery disease   . Arthritis   . Cancer   . Anemia   . Diabetes mellitus without complication   . Hypercholesteremia   . Hypertension   . Hypothyroidism   . Anxiety   . Dementia   .  Poor historian   . Cataract of both eyes     Medications:  Scheduled:  . albuterol  2.5 mg Nebulization Q4H  . amLODipine  5 mg Oral Daily  . [START ON 03/23/2014] antiseptic oral rinse  15 mL Mouth Rinse QID  . ceFEPime (MAXIPIME) IV  2 g Intravenous Q24H  . chlorhexidine  15 mL Mouth Rinse BID  . clopidogrel  75 mg Oral Q breakfast  . famotidine (PEPCID) IV  20 mg Intravenous Q12H  . hydrocortisone  1 application Topical BID  . insulin aspart  0-15 Units Subcutaneous TID WC  . insulin aspart  0-5 Units Subcutaneous QHS  . levothyroxine  112 mcg Oral QAC breakfast  . loratadine  10 mg Oral Daily  . piperacillin-tazobactam (ZOSYN)  IV  3.375 g Intravenous Q8H  . simvastatin  20 mg Oral QHS  . Tbo-Filgrastim  300 mcg Subcutaneous Once  . vancomycin  1,000 mg Intravenous Q24H  . zolpidem  5 mg Oral Daily   Assessment: Cefepime for neutropenic fever Reduced renal function  Goal of Therapy:  Eradicate infection  Plan:  Cefepime 2 GM IV every 24 hours Monitor renal Labs per protocol  Nozomi Mettler Lenoard Aden 03/27/2014,6:42 PM

## 2014-03-27 NOTE — Anesthesia Procedure Notes (Signed)
Procedure Name: Intubation Date/Time: 03/27/2014 6:16 PM Performed by: Charmaine Downs Pre-anesthesia Checklist: Patient identified, Patient being monitored, Suction available and Emergency Drugs available Patient Re-evaluated:Patient Re-evaluated prior to inductionOxygen Delivery Method: Ambu bag Preoxygenation: Pre-oxygenation with 100% oxygen Intubation Type: IV induction, Rapid sequence and Cricoid Pressure applied Laryngoscope Size: Mac and 3 Grade View: Grade II Tube type: Oral Tube size: 7.5 mm Number of attempts: 1 Airway Equipment and Method: Stylet Placement Confirmation: ETT inserted through vocal cords under direct vision,  positive ETCO2,  breath sounds checked- equal and bilateral and CO2 detector Secured at: 23 cm Tube secured with: Tape Dental Injury: Teeth and Oropharynx as per pre-operative assessment  Difficulty Due To: Difficulty was anticipated

## 2014-03-27 NOTE — Brief Op Note (Signed)
Controlled ICU intubation: Pre procedure  97/36,saO2 98,HR 95     After pre O2..Amidate 10 mg and Anectine 100% Small amount bile colored fluid noted in oro-pharynx.With suctioning and cricoid pressure;7.5 ETT passed and verified with Co2 detector.Chest x-ray obtained.  Post intubation:82/40,96 HR,saO2 98%

## 2014-03-27 NOTE — Progress Notes (Signed)
ANTIBIOTIC CONSULT NOTE  - Follow up  Pharmacy Consult for Vancomycin & Zosyn Indication: Neutropenic fever  No Known Allergies  Patient Measurements: Height: 5\' 6"  (167.6 cm) Weight: 202 lb 13.2 oz (92 kg) IBW/kg (Calculated) : 59.3 Adjusted Body Weight:   Vital Signs: Temp: 99.4 F (37.4 C) (04/12 0724) Temp src: Oral (04/12 0724) BP: 119/76 mmHg (04/12 0724) Pulse Rate: 116 (04/12 0724) Intake/Output from previous day: 04/11 0701 - 04/12 0700 In: -  Out: 1 [Stool:1] Intake/Output from this shift:    Labs:  Recent Labs  03/20/2014 1521 03/27/14 0547  WBC 1.3* 0.9*  HGB 11.6* 11.1*  PLT 318 267  CREATININE 1.43* 2.02*   Estimated Creatinine Clearance: 21.2 ml/min (by C-G formula based on Cr of 2.02). No results found for this basename: VANCOTROUGH, VANCOPEAK, VANCORANDOM, [place], GENTPEAK, GENTRANDOM, TOBRATROUGH, TOBRAPEAK, TOBRARND, AMIKACINPEAK, AMIKACINTROU, AMIKACIN,  in the last 72 hours   Microbiology: Recent Results (from the past 720 hour(s))  CULTURE, BLOOD (ROUTINE X 2)     Status: None   Collection Time    04/04/2014  4:30 PM      Result Value Ref Range Status   Specimen Description LEFT ANTECUBITAL   Final   Special Requests BOTTLES DRAWN AEROBIC AND ANAEROBIC 6CC   Final   Culture NO GROWTH 1 DAY   Final   Report Status PENDING   Incomplete  CULTURE, BLOOD (ROUTINE X 2)     Status: None   Collection Time    04/13/2014  4:33 PM      Result Value Ref Range Status   Specimen Description PORTA CATH   Final   Special Requests BOTTLES DRAWN AEROBIC AND ANAEROBIC [place]   Final   Culture NO GROWTH 1 DAY   Final   Report Status PENDING   Incomplete    Medical History: Past Medical History  Diagnosis Date  . Coronary artery disease   . Arthritis   . Cancer   . Anemia   . Diabetes mellitus without complication   . Hypercholesteremia   . Hypertension   . Hypothyroidism   . Anxiety   . Dementia   . Poor historian   . Cataract of both eyes      Medications:  Scheduled:  . amLODipine  5 mg Oral Daily  . clopidogrel  75 mg Oral Q breakfast  . hydrocortisone  1 application Topical BID  . insulin aspart  0-15 Units Subcutaneous TID WC  . insulin aspart  0-5 Units Subcutaneous QHS  . insulin glargine  15 Units Subcutaneous QHS  . levothyroxine  112 mcg Oral QAC breakfast  . loratadine  10 mg Oral Daily  . piperacillin-tazobactam (ZOSYN)  IV  3.375 g Intravenous Q8H  . simvastatin  20 mg Oral QHS  . vancomycin  1,000 mg Intravenous Q24H  . zolpidem  5 mg Oral Daily   Assessment: Neutropenic fever, breast cancer patient, treatment on Wednesday Reduced renal function, worsening today. CrCl 21.2 ml/min  Goal of Therapy:  Vancomycin trough level 15-20 mcg/ml  Plan:  Reduce Vancomycin to 1000 mg IV every 24 hours Continue Zosyn 3.375 GM IV every 8 hours, infuse each dose over 4 hours Vancomycin trough at steady state Monitor renal function Labs per protocol  Tenea Sens [employer] 03/27/2014,9:18 AM

## 2014-03-27 NOTE — Consult Note (Signed)
[facility] Medical Consultation  April Myers [id number] DOB: 11/11/1921 DOA: 04/04/2014 PCP: Rosita Fire, MD   Requesting physician: Dr. Luan Pulling Date of consultation: 03/27/2014 Reason for consultation: Recommendations regarding goals of care  Impression/Recommendations Principal Problem:   Neutropenic fever Active Problems:   Breast cancer, left breast   Insulin-requiring or dependent type II diabetes mellitus   Hypothyroidism   Dyslipidemia   HTN (hypertension)   Anemia of chronic disease   Acute renal failure  Sepsis  Metabolic acidosis  Acute respiratory failure  Pneumonia  Recommendations:  1. Acute respiratory failure, likely related to pneumonia as well as respiratory compensation from a severe metabolic acidosis. Patient's respiratory rate is currently in the 30s to 40s on BiPAP. Her mental status appears to be declining. She does not appear to have any chronic underlying lung disease, at least based on what is available in the medical record. She does have dementia, but appears to be reasonably functional at baseline. She was recently diagnosed with breast cancer and started on chemotherapy. She appears to be significantly younger than her stated age. I agree with broad-spectrum antibiotics which the patient is already receiving. Would consider starting the patient on Neupogen, since she is neutropenic and appears to be in sepsis. Would also recommend starting the patient on a bicarbonate infusion versus bicarbonate pushes. By correcting her metabolic acidosis, this may help improve her tachypnea. Since there is a possibility that patient may improve with aggressive measures such as intubation and mechanical ventilation, I do not feel that these options would be unreasonable. I would not recommend other aggressive measures such as chest compressions/defibrillation.  Thank you for this consultation. Please let us know if we can be of any further assistance.  Chief  Complaint: shortness of breath  HPI:  This is a 80 year old female with history of breast cancer was recently started on chemotherapy who presents to the emergency room with progressive weakness, fatigue. She was found to be mildly hypotensive, tachycardic and febrile. She was also neutropenic. He was admitted to the hospital for further treatment. Her hospitalization, she quickly decompensated and required transfer to the ICU. She became increasingly short of breath and lethargic. She was placed on BiPAP due to increased work of breathing. Lab work indicated a worsening metabolic acidosis, renal failure. Her leukopenia got worse. Chest x-ray indicated a possible developing pneumonia. Consideration was made for further aggressive treatment including intubation. Patient is being followed by pulmonology, Dr. Luan Pulling. Her power of attorney is social services department, she is a ward of state. When goals of care were discussed with the social services, it was requested that a second physician evaluate the patient and make recommendations regarding goals of care. The hospitalist service was requested to assist in this matter.  Review of Systems:  Unable to assess due to mental status  Past Medical History  Diagnosis Date  . Coronary artery disease   . Arthritis   . Cancer   . Anemia   . Diabetes mellitus without complication   . Hypercholesteremia   . Hypertension   . Hypothyroidism   . Anxiety   . Dementia   . Poor historian   . Cataract of both eyes    Past Surgical History  Procedure Laterality Date  . Mastectomy Right 2006  . Portacath placement N/A 02/21/2014    Procedure: INSERTION POWER PORT-A-CATH RIGHT SUBCLAVIAN;  Surgeon: Jamesetta So, MD;  Location: [facility];  Service: General;  Laterality: N/A;   Social History:  reports that she  has quit smoking. Her smoking use included Cigarettes. She smoked 0.00 packs per day. She does not have any smokeless tobacco history on file. She  reports that she does not drink alcohol or use illicit drugs.  No Known Allergies History reviewed. No pertinent family history.  Prior to Admission medications   Medication Sig Start Date End Date Taking? Authorizing Provider  amLODipine (NORVASC) 5 MG tablet Take 5 mg by mouth daily.   Yes Historical Provider, MD  clopidogrel (PLAVIX) 75 MG tablet Take 75 mg by mouth daily with breakfast.   Yes Historical Provider, MD  hydrocortisone 1 % ointment Apply 1 application topically 2 (two) times daily.   Yes Historical Provider, MD  Insulin Glargine (LANTUS SOLOSTAR) 100 UNIT/ML Solostar Pen Inject 15 Units into the skin at bedtime.   Yes Historical Provider, MD  levothyroxine (SYNTHROID, LEVOTHROID) 112 MCG tablet Take 112 mcg by mouth daily.   Yes Historical Provider, MD  loratadine (CLARITIN) 10 MG tablet Take 10 mg by mouth daily.   Yes Historical Provider, MD  metFORMIN (GLUCOPHAGE) 500 MG tablet Take 500 mg by mouth 2 (two) times daily with a meal.   Yes Historical Provider, MD  simvastatin (ZOCOR) 20 MG tablet Take 20 mg by mouth at bedtime.   Yes Historical Provider, MD  zolpidem (AMBIEN) 5 MG tablet Take 5 mg by mouth daily.    Yes Historical Provider, MD  HYDROcodone-acetaminophen (NORCO/VICODIN) 5-325 MG per tablet Take 1 tablet by mouth every 6 (six) hours as needed for moderate pain. 02/21/14 02/21/15  Jamesetta So, MD  lidocaine-prilocaine (EMLA) cream Apply a quarter size amount to port site 1 hour prior to chemo. Do not rub in. Cover with plastic wrap. 03/01/14   Farrel Gobble, MD  loperamide (IMODIUM) 2 MG capsule Take 2 capsules after the 1st loose stool, and then 1 capsule every 4 hours if needed for loose stools. Not to exceed 8 capsules in a 24 hour period. 03/01/14   Baird Cancer, PA-C  metoCLOPramide (REGLAN) 5 MG tablet Starting the day after chemo, take 1 tablet four times a day x 48 hours. Then may take 1 tablet four times a day if needed for nausea/vomiting. 03/01/14    Farrel Gobble, MD  prochlorperazine (COMPAZINE) 10 MG tablet Starting the day after chemo, take 1 tablet four times a day x 48 hours. Then may take 1 tablet four times a day if needed for nausea/vomiting. 03/01/14   Farrel Gobble, MD   Physical Exam: Blood pressure 78/45, pulse 104, temperature 99.4 F (37.4 C), temperature source Oral, resp. rate 28, height 5\' 6"  (1.676 m), weight 92 kg (202 lb 13.2 oz), SpO2 100.00%. Filed Vitals:   03/27/14 1600  BP: 78/45  Pulse: 104  Temp:   Resp: 28     General:  Patient is lying in bed, she has increased work of breathing, BiPAP mask is in place.  Eyes: Pupils are equal, round, reactive to light  ENT: Unable to assess due to BiPAP mask  Neck: Supple  Cardiovascular: S1, S2, tachycardic  Respiratory: Scattered rhonchi bilaterally  Abdomen: Soft, nontender, positive bowel sounds  Skin: No rashes based on my limited exam  Musculoskeletal: No pedal edema bilaterally  Psychiatric: Unable to assess due to lethargy  Neurologic: Limited exam due to lethargy, no gross abnormalities  Labs on Admission:  Basic Metabolic Panel:  Recent Labs Lab 03/18/2014 1521 04/07/2014 1743 03/27/14 0547  NA 136*  --  139  K 4.7  --  4.9  CL 100  --  104  CO2 14*  --  15*  GLUCOSE 256*  --  241*  BUN 93*  --  92*  CREATININE 1.43*  --  2.02*  CALCIUM 8.4  --  7.9*  MG  --  1.8  --   PHOS  --  3.9  --    Liver Function Tests:  Recent Labs Lab 03/25/2014 0515 03/27/14 0547  AST 10 16  ALT 10 10  ALKPHOS 65 58  BILITOT 0.5 0.6  PROT 7.9 7.2  ALBUMIN 3.0* 2.7*   No results found for this basename: LIPASE, AMYLASE,  in the last 168 hours No results found for this basename: AMMONIA,  in the last 168 hours CBC:  Recent Labs Lab 03/23/14 1008 03/23/2014 1521 03/27/14 0547  WBC 2.4* 1.3* 0.9*  NEUTROABS 1.5* 1.0*  --   HGB 10.8* 11.6* 11.1*  HCT 32.2* 33.8* 32.3*  MCV 83.0 80.3 80.0  PLT 388 318 267   Cardiac Enzymes: No results  found for this basename: CKTOTAL, CKMB, CKMBINDEX, TROPONINI,  in the last 168 hours BNP: No components found with this basename: POCBNP,  CBG:  Recent Labs Lab 03/17/2014 2048 03/27/14 0720 03/27/14 1132 03/27/14 1620  GLUCAP 220* 210* 166* 203*    Radiological Exams on Admission: Dg Chest 1 View  03/27/2014   CLINICAL DATA:  Infection  EXAM: CHEST - 1 VIEW  COMPARISON:  DG CHEST 2 VIEW dated 03/19/2014; DG CHEST 2 VIEW dated 03/03/2014; DG CHEST 1V PORT dated 04/17/2005; NM PET IMAGE INITIAL (PI) SKULL BASE TO THIGH dated 03/01/2014  FINDINGS: The lungs are adequately inflated. There is an irregular increased density in the right suprahilar region medial to the power port present for which is new since yesterday's study and likely reflects a structure outside the thorax. The tip of the power port catheter lies in the region of the midportion of the SVC. The cardiopericardial silhouette is mildly enlarged though stable. The pulmonary vascularity is not engorged. There is no pleural effusion. There are surgical clips at the base of the neck.  IMPRESSION: There is mildly increased density in the retrocardiac region on the left that is not entirely new. This may reflect atelectasis or early infiltrate. When the patient can tolerate the procedure, a PA and lateral chest x-ray would be of value.   Electronically Signed   By: David  Martinique   On: 03/27/2014 13:18   Dg Chest 2 View  04/04/2014   CLINICAL DATA:  Fever and weakness and confusion  EXAM: CHEST  2 VIEW  COMPARISON:  DG CHEST 2 VIEW dated 03/03/2014  FINDINGS: The lungs are adequately inflated. There is no focal infiltrate. The cardiopericardial silhouette is mildly enlarged. The pulmonary vascularity is not engorged. There is no pleural effusion. The Port-A-Cath appliance tip lies in the region of the midportion of the SVC. There are surgical clips at the base of the neck.  IMPRESSION: There is no evidence of pneumonia. There is mild stable  enlargement of the cardiac silhouette without pulmonary vascular congestion.   Electronically Signed   By: David  Martinique   On: 03/24/2014 16:15   Ct Head Wo Contrast  03/27/2014   CLINICAL DATA:  Altered mental status.  EXAM: CT HEAD WITHOUT CONTRAST  TECHNIQUE: Contiguous axial images were obtained from the base of the skull through the vertex without intravenous contrast.  COMPARISON:  Brain MRI 01/27/2009 Head CT 04/15/2005  FINDINGS: The patient's head is tilted in  the scanner. The technologist states that it was difficult to straighten the patient. Encephalomalacia noted at the site of of prior left parietal lobe infarct, as described on prior MRI. Stable low-density lesion in the right basal ganglia, previously described as a cyst on MRI. Ventricles normal in size. Negative for hydrocephalus. Negative for hemorrhage. No evidence of mass effect.  The skull is intact. The visualized paranasal sinuses and mastoid air cells are clear.  IMPRESSION: Stable head CT. 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 No acute intracranial abnormality identified.   Electronically Signed   By: Curlene Dolphin M.D.   On: 03/27/2014 13:30    EKG: Independently reviewed. Sinus tachycardia with PACs  Time spent: 13mins  Keandre Linden [facility] Pager [phone]  If 7PM-7AM, please contact night-coverage [email or web address] Password [facility] 03/27/2014, 5:30 PM

## 2014-03-27 NOTE — Progress Notes (Signed)
Subjective: Patient was admitted yesterday due to fever. She has neutropenia and acute renal failure. She was recently started on chemotherapy for ca of the breast. Patient confused and disoriented and unable to give history.  Objective: Vital signs in last 24 hours: Temp:  [98.6 F (37 C)-101.1 F (38.4 C)] 99.4 F (37.4 C) (04/12 0724) Pulse Rate:  [98-116] 116 (04/12 0724) Resp:  [25-26] 25 (04/12 0724) BP: (101-134)/(72-86) 119/76 mmHg (04/12 0724) SpO2:  [95 %-100 %] 96 % (04/12 0724) Weight:  [89.359 kg (197 lb)-92 kg (202 lb 13.2 oz)] 92 kg (202 lb 13.2 oz) (04/12 0403) Weight change:  Last BM Date:  (unknown)  Intake/Output from previous day: 04/11 0701 - 04/12 0700 In: -  Out: 1 [Stool:1]  PHYSICAL EXAM General appearance: delirious and slowed mentation Resp: clear to auscultation bilaterally Cardio: S1, S2 normal GI: soft, non-tender; bowel sounds normal; no masses,  no organomegaly Extremities: extremities normal, atraumatic, no cyanosis or edema  Lab Results:    @labtest @ [facility] No results found for this basename: PHART, PCO2, PO2ART, TCO2, HCO3,  in the last 72 hours CULTURES Recent Results (from the past 240 hour(s))  CULTURE, BLOOD (ROUTINE X 2)     Status: None   Collection Time    04/21/14  4:30 PM      Result Value Ref Range Status   Specimen Description LEFT ANTECUBITAL   Final   Special Requests BOTTLES DRAWN AEROBIC AND ANAEROBIC 6CC   Final   Culture NO GROWTH 1 DAY   Final   Report Status PENDING   Incomplete  CULTURE, BLOOD (ROUTINE X 2)     Status: None   Collection Time    Apr 21, 2014  4:33 PM      Result Value Ref Range Status   Specimen Description PORTA CATH   Final   Special Requests BOTTLES DRAWN AEROBIC AND ANAEROBIC [place]   Final   Culture NO GROWTH 1 DAY   Final   Report Status PENDING   Incomplete   Studies/Results: Dg Chest 2 View  Apr 21, 2014   CLINICAL DATA:  Fever and weakness and confusion  EXAM: CHEST  2 VIEW  COMPARISON:  DG  CHEST 2 VIEW dated 03/03/2014  FINDINGS: The lungs are adequately inflated. There is no focal infiltrate. The cardiopericardial silhouette is mildly enlarged. The pulmonary vascularity is not engorged. There is no pleural effusion. The Port-A-Cath appliance tip lies in the region of the midportion of the SVC. There are surgical clips at the base of the neck.  IMPRESSION: There is no evidence of pneumonia. There is mild stable enlargement of the cardiac silhouette without pulmonary vascular congestion.   Electronically Signed   By: David  Martinique   On: 2014/04/21 16:15    Medications: I have reviewed the patient's current medications.  Assesment: Principal Problem:   Neutropenic fever Active Problems:   Breast cancer, left breast   Insulin-requiring or dependent type II diabetes mellitus   Hypothyroidism   Dyslipidemia   HTN (hypertension)   Anemia of chronic disease   Acute renal failure    Plan: Medications reviewed Continue Iv hydration and IV antibiotics Will monitor CBC and BMP Will do nephrology and oncology consult    LOS: 1 day   Jonhatan Hearty 03/27/2014, 9:53 AM

## 2014-03-28 LAB — URINALYSIS, ROUTINE W REFLEX MICROSCOPIC
Bilirubin Urine: NEGATIVE
Glucose, UA: NEGATIVE mg/dL
Ketones, ur: NEGATIVE mg/dL
Leukocytes, UA: NEGATIVE
Nitrite: NEGATIVE
Protein, ur: 100 mg/dL — AB
Specific Gravity, Urine: 1.025 (ref 1.005–1.030)
Urobilinogen, UA: 0.2 mg/dL (ref 0.0–1.0)
pH: 5.5 (ref 5.0–8.0)

## 2014-03-28 LAB — URINE MICROSCOPIC-ADD ON

## 2014-03-28 LAB — TSH: TSH: 0.449 u[IU]/mL (ref 0.350–4.500)

## 2014-03-28 MED ORDER — PHENYLEPHRINE HCL 10 MG/ML IJ SOLN
30.0000 ug/min | INTRAMUSCULAR | Status: DC
  Administered 2014-03-28: 30 ug/min via INTRAVENOUS
  Filled 2014-03-28: qty 4

## 2014-03-28 MED ORDER — NOREPINEPHRINE BITARTRATE 1 MG/ML IJ SOLN
2.0000 ug/min | INTRAVENOUS | Status: DC
  Filled 2014-03-28: qty 4

## 2014-03-28 MED ORDER — PHENYLEPHRINE HCL 10 MG/ML IJ SOLN
INTRAMUSCULAR | Status: AC
  Filled 2014-03-28: qty 4

## 2014-03-29 LAB — URINE CULTURE
COLONY COUNT: NO GROWTH
CULTURE: NO GROWTH

## 2014-03-30 ENCOUNTER — Ambulatory Visit (HOSPITAL_COMMUNITY): Payer: Medicare Other

## 2014-03-30 ENCOUNTER — Inpatient Hospital Stay (HOSPITAL_COMMUNITY): Payer: Medicare Other

## 2014-03-31 LAB — CULTURE, BLOOD (ROUTINE X 2)
Culture: NO GROWTH
Culture: NO GROWTH

## 2014-04-01 LAB — CULTURE, BLOOD (ROUTINE X 2): CULTURE: NO GROWTH

## 2014-04-06 ENCOUNTER — Inpatient Hospital Stay (HOSPITAL_COMMUNITY): Payer: Medicare Other

## 2014-04-06 ENCOUNTER — Ambulatory Visit (HOSPITAL_COMMUNITY): Payer: Medicare Other | Admitting: Oncology

## 2014-04-06 NOTE — Care Management Utilization Note (Signed)
UR completed 

## 2014-04-15 NOTE — Addendum Note (Signed)
Addendum created Apr 02, 2014 1538 by Vista Deck, CRNA   Modules edited: Charges VN

## 2014-04-15 NOTE — Progress Notes (Signed)
Patient's bp unstable at this time to roll over for tylenol suppository.  Wasted med with Kathie Rhodes, Dannielle Burn Schonewitz 03/31/2014

## 2014-04-15 NOTE — Discharge Summary (Addendum)
Physician Discharge Summary  Patient ID: April Myers MRN: [id number] DOB/AGE: 06-22-1921 80 y.o. Primary Care Physician:FANTA,TESFAYE, MD Admit date: 03/22/2014 Discharge date: 04/16/14    Discharge Diagnoses:   Principal Problem:   Neutropenic fever Active Problems:   Breast cancer, left breast   Insulin-requiring or dependent type II diabetes mellitus   Hypothyroidism   Dyslipidemia   HTN (hypertension)   Anemia of chronic disease   Acute renal failure severe sepsis hcap Metabolic acidemia Atrial fibrillation with rapid ventricular response Acute respiratory failure Metabolic encephalopathy   Medication List    ASK your doctor about these medications       amLODipine 5 MG tablet  Commonly known as:  NORVASC  Take 5 mg by mouth daily.     clopidogrel 75 MG tablet  Commonly known as:  PLAVIX  Take 75 mg by mouth daily with breakfast.     HYDROcodone-acetaminophen 5-325 MG per tablet  Commonly known as:  NORCO/VICODIN  Take 1 tablet by mouth every 6 (six) hours as needed for moderate pain.     hydrocortisone 1 % ointment  Apply 1 application topically 2 (two) times daily.     LANTUS SOLOSTAR 100 UNIT/ML Solostar Pen  Generic drug:  Insulin Glargine  Inject 15 Units into the skin at bedtime.     levothyroxine 112 MCG tablet  Commonly known as:  SYNTHROID, LEVOTHROID  Take 112 mcg by mouth daily.     lidocaine-prilocaine cream  Commonly known as:  EMLA  Apply a quarter size amount to port site 1 hour prior to chemo. Do not rub in. Cover with plastic wrap.     loperamide 2 MG capsule  Commonly known as:  IMODIUM  Take 2 capsules after the 1st loose stool, and then 1 capsule every 4 hours if needed for loose stools. Not to exceed 8 capsules in a 24 hour period.     loratadine 10 MG tablet  Commonly known as:  CLARITIN  Take 10 mg by mouth daily.     metFORMIN 500 MG tablet  Commonly known as:  GLUCOPHAGE  Take 500 mg by mouth 2 (two) times daily with  a meal.     metoCLOPramide 5 MG tablet  Commonly known as:  REGLAN  Starting the day after chemo, take 1 tablet four times a day x 48 hours. Then may take 1 tablet four times a day if needed for nausea/vomiting.     prochlorperazine 10 MG tablet  Commonly known as:  COMPAZINE  Starting the day after chemo, take 1 tablet four times a day x 48 hours. Then may take 1 tablet four times a day if needed for nausea/vomiting.     simvastatin 20 MG tablet  Commonly known as:  ZOCOR  Take 20 mg by mouth at bedtime.     zolpidem 5 MG tablet  Commonly known as:  AMBIEN  Take 5 mg by mouth daily.        Discharged Condition: Deceased    Consults:Dr. Memon, triad hospitalist  Significant Diagnostic Studies: Dg Chest 1 View  03/27/2014   CLINICAL DATA:  Infection  EXAM: CHEST - 1 VIEW  COMPARISON:  DG CHEST 2 VIEW dated 03/27/2014; DG CHEST 2 VIEW dated 03/03/2014; DG CHEST 1V PORT dated 04/17/2005; NM PET IMAGE INITIAL (PI) SKULL BASE TO THIGH dated 03/01/2014  FINDINGS: The lungs are adequately inflated. There is an irregular increased density in the right suprahilar region medial to the power port present for which is  new since yesterday's study and likely reflects a structure outside the thorax. The tip of the power port catheter lies in the region of the midportion of the SVC. The cardiopericardial silhouette is mildly enlarged though stable. The pulmonary vascularity is not engorged. There is no pleural effusion. There are surgical clips at the base of the neck.  IMPRESSION: There is mildly increased density in the retrocardiac region on the left that is not entirely new. This may reflect atelectasis or early infiltrate. When the patient can tolerate the procedure, a PA and lateral chest x-ray would be of value.   Electronically Signed   By: David  Martinique   On: 03/27/2014 13:18   Dg Chest 2 View  03/31/2014   CLINICAL DATA:  Fever and weakness and confusion  EXAM: CHEST  2 VIEW  COMPARISON:  DG  CHEST 2 VIEW dated 03/03/2014  FINDINGS: The lungs are adequately inflated. There is no focal infiltrate. The cardiopericardial silhouette is mildly enlarged. The pulmonary vascularity is not engorged. There is no pleural effusion. The Port-A-Cath appliance tip lies in the region of the midportion of the SVC. There are surgical clips at the base of the neck.  IMPRESSION: There is no evidence of pneumonia. There is mild stable enlargement of the cardiac silhouette without pulmonary vascular congestion.   Electronically Signed   By: David  Martinique   On: 03/16/2014 16:15   Dg Chest 2 View  03/03/2014   CLINICAL DATA:  Tiny right  EXAM: CHEST  2 VIEW  COMPARISON:  Chest x-ray dated 02/21/2014  FINDINGS: Right-sided power port is in place. There is a tiny right apical pneumothorax. Lungs are clear. Heart size and vascularity are normal. Multiple surgical clips in the right axilla secondary to prior right mastectomy.  IMPRESSION: Tiny right apical pneumothorax.   Electronically Signed   By: Rozetta Nunnery M.D.   On: 03/03/2014 16:22   Ct Head Wo Contrast  03/27/2014   CLINICAL DATA:  Altered mental status.  EXAM: CT HEAD WITHOUT CONTRAST  TECHNIQUE: Contiguous axial images were obtained from the base of the skull through the vertex without intravenous contrast.  COMPARISON:  Brain MRI 01/27/2009 Head CT 04/15/2005  FINDINGS: The patient's head is tilted in the scanner. The technologist states that it was difficult to straighten the patient. Encephalomalacia noted at the site of of prior left parietal lobe infarct, as described on prior MRI. Stable low-density lesion in the right basal ganglia, previously described as a cyst on MRI. Ventricles normal in size. Negative for hydrocephalus. Negative for hemorrhage. No evidence of mass effect.  The skull is intact. The visualized paranasal sinuses and mastoid air cells are clear.  IMPRESSION: Stable head CT. 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 No acute intracranial  abnormality identified.   Electronically Signed   By: Curlene Dolphin M.D.   On: 03/27/2014 13:30   Nm Pet Image Initial (pi) Skull Base To Thigh  03/01/2014   CLINICAL DATA:  Initial treatment strategy for breast carcinoma. Left breast carcinoma. Prior history of right breast carcinoma with mastectomy.  EXAM: NUCLEAR MEDICINE PET SKULL BASE TO THIGH  TECHNIQUE: 11.5 mCi F-18 FDG was injected intravenously. Full-ring PET imaging was performed from the skull base to thigh after the radiotracer. CT data was obtained and used for attenuation correction and anatomic localization.  FASTING BLOOD GLUCOSE:  Value: 139 mg/dl  COMPARISON:  None.  FINDINGS: NECK  No hypermetabolic lymph nodes in the neck.  CHEST  There is a  cluster of hypermetabolic lesions within the lateral left breast with intense metabolic activity (SUV max 12.3). There are adjacent enlarged left axillary lymph nodes measuring 18 mm short axis (image 61) with SUV max 9.4. No hypermetabolic internal mammary lymph nodes. No hypermetabolic supraclavicular or infraclavicular nodes. No hypermetabolic mediastinal nodes.  Review of the lung parenchyma demonstrates a small anterior right pneumothorax extending to the lung bases. Estimated volume of this pneumothorax is 10% of the right hemi thorax volume. There is small right effusion.  There is mild metabolic activity along the tract of the port beneath the clavicle also likely related to inflammation from port placement.  ABDOMEN/PELVIS  No evidence of liver metastasis. No hypermetabolic abdominal pelvic lymph nodes. Renal glands are normal.  There is a large ventral hernia which to the long segment of nonobstructed small bowel. No aggressive osseous lesion. Chronic calcified granulation lateral to the left hip (image 164_.  SKELETON  No focal hypermetabolic activity to suggest skeletal metastasis.  IMPRESSION: 1. Hypermetabolic primary breast cancer in the left lateral breast. 2. Hypermetabolic axillary nodal  metastasis on the left. 3. No evidence of central nodal metastasis or distant metastasis. 4. Small right anterior pneumothorax and pleural fluid related to port placement on 02/21/2014. [facility]01/2014  at12:45.   Electronically Signed   By: Suzy Bouchard M.D.   On: 03/01/2014 12:46   Dg Chest Port 1 View  03/27/2014   CLINICAL DATA:  Status post intubation.  EXAM: PORTABLE CHEST - 1 VIEW  COMPARISON:  DG CHEST 1 VIEW dated 03/27/2014  FINDINGS: Endotracheal tube has been placed with the tip approximately 2 cm above the carina. Port-A-Cath positioning is stable. A gastric decompression tube extends below the diaphragm. Lungs show worsening atelectasis versus infiltrate in the right lower lung. The heart size is stable. No significant pleural effusions are identified. There is no evidence of pneumothorax.  IMPRESSION: Endotracheal tube tip approximately 2 cm above the carina.   Electronically Signed   By: Aletta Edouard M.D.   On: 03/27/2014 19:04    Lab Results: Basic Metabolic Panel:  Recent Labs  2014/04/21 1521 Apr 21, 2014 1743 03/27/14 0547 03/27/14 1910  NA 136*  --  139 145  K 4.7  --  4.9 4.6  CL 100  --  104 108  CO2 14*  --  15* 15*  GLUCOSE 256*  --  241* 194*  BUN 93*  --  92* 99*  CREATININE 1.43*  --  2.02* 3.33*  CALCIUM 8.4  --  7.9* 6.9*  MG  --  1.8  --   --   PHOS  --  3.9  --   --    Liver Function Tests:  Recent Labs  Apr 21, 2014 0515 03/27/14 0547  AST 10 16  ALT 10 10  ALKPHOS 65 58  BILITOT 0.5 0.6  PROT 7.9 7.2  ALBUMIN 3.0* 2.7*     CBC:  Recent Labs  Apr 21, 2014 1521 03/27/14 0547  WBC 1.3* 0.9*  NEUTROABS 1.0*  --   HGB 11.6* 11.1*  HCT 33.8* 32.3*  MCV 80.3 80.0  PLT 318 267    Recent Results (from the past 240 hour(s))  URINE CULTURE     Status: None   Collection Time    04/21/2014  3:45 PM      Result Value Ref Range Status   Specimen Description URINE, CATHETERIZED   Final   Special Requests NONE    Final   Culture  Setup  Time     Final   Value: Mar 30, 2014 19:56     Performed at [facility]     Final   Value: NO GROWTH     Performed at [facility]   Culture     Final   Value: NO GROWTH     Performed at [facility]   Report Status 03/27/2014 FINAL   Final  CULTURE, BLOOD (ROUTINE X 2)     Status: None   Collection Time    March 30, 2014  4:30 PM      Result Value Ref Range Status   Specimen Description LEFT ANTECUBITAL   Final   Special Requests BOTTLES DRAWN AEROBIC AND ANAEROBIC 6CC   Final   Culture NO GROWTH 1 DAY   Final   Report Status PENDING   Incomplete  CULTURE, BLOOD (ROUTINE X 2)     Status: None   Collection Time    Mar 30, 2014  4:33 PM      Result Value Ref Range Status   Specimen Description PORTA CATH   Final   Special Requests BOTTLES DRAWN AEROBIC AND ANAEROBIC 6CC   Final   Culture NO GROWTH 1 DAY   Final   Report Status PENDING   Incomplete  MRSA PCR SCREENING     Status: None   Collection Time    03/27/14 11:30 AM      Result Value Ref Range Status   MRSA by PCR NEGATIVE  NEGATIVE Final   Comment:            The GeneXpert MRSA Assay (FDA     approved for NASAL specimens     only), is one component of a     comprehensive MRSA colonization     surveillance program. It is not     intended to diagnose MRSA     infection nor to guide or     monitor treatment for     MRSA infections.     Hospital Course: This is a 80 year old who has been being treated for breast cancer with chemotherapy. In addition to that she has known coronary artery occlusive disease diabetes and osteoarthritis. There is listed a history of dementia but I'm not sure how severe that is. When she came to the emergency room she was found to have febrile neutropenia and was started on appropriate antibiotics. The next morning she was much less responsive and was transferred to the intensive care unit. Chest x-ray showed what might be a pneumonia CT of  the brain did not show any acute abnormalities. She developed increasing renal failure. Her blood pressure began to decline and she was started on treatment for sepsis. Because of her advanced age and her cancer I discussed with her power of attorney who is the [place] about how aggressive they intended to her treatment to be. I requested consultation from [facility] hospitalist which was kindly provided it was felt that we should intubate her but not plan to do CPR electrical shocks et Ronney Asters. She was intubated and placed on mechanical ventilation. She continued to have decline in her blood pressure despite large volumes of IV fluids. She was noted to have markedly decreased bicarbonate level and this was treated. She was placed on pressors. She continued to have decline in her status and eventually died at 1:06 AM  Discharge Exam: Blood pressure 44/22, pulse 30, temperature 102.4 F (39.1 C), temperature source Rectal, resp. rate 14,  height 5\' 6"  (1.676 m), weight 92 kg (202 lb 13.2 oz), SpO2 79.00%. Not applicable  Disposition: Released the body to funeral home      Signed: Alonza Bogus   04-23-14, 7:55 AM

## 2014-04-15 NOTE — Progress Notes (Addendum)
Patient found with no pulse, no BP, no spontaneous breathing.  Pronounced by Burna Sis, RN and Hardin Negus, RN.  MD notified.  [name], DSS on call worker notified. CDS notified and gave full release of body.  April Myers 03/17/2014

## 2014-04-15 DEATH — deceased

## 2014-08-09 ENCOUNTER — Other Ambulatory Visit: Payer: Self-pay | Admitting: *Deleted

## 2015-09-01 IMAGING — US US BREAST*L*
1 series · 13 of 19 positions shown · non-contrast
Comparison: 10/09/2009 and 10/12/2009

CLINICAL DATA: Left breast mass. Patient is status post right
mastectomy. The patient lives in a [HOSPITAL] and is accompanied
by a caregiver. Her health care power-of-attorney, who is from
social services, is not with her today.

EXAM:
DIGITAL DIAGNOSTIC  LEFT MAMMOGRAM WITH CAD
ULTRASOUND LEFT BREAST

[Series 1: us breast*left* · 0.09mm/px · 13 of 19 slices shown]
[im 1/19]
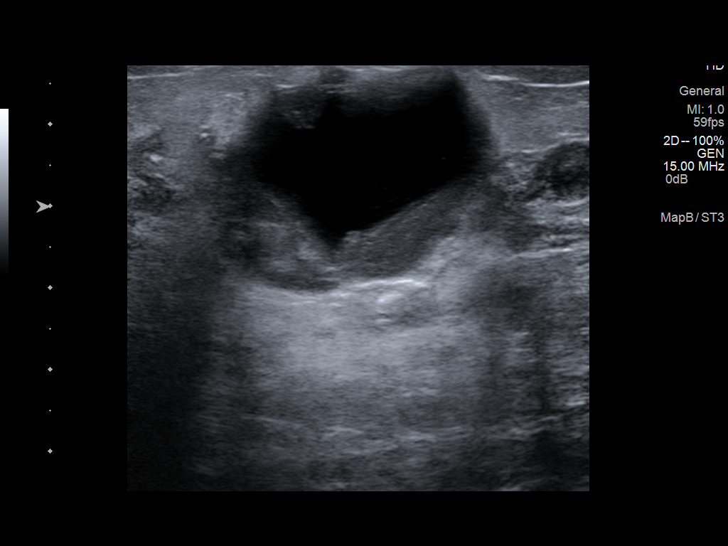
[im 3/19]
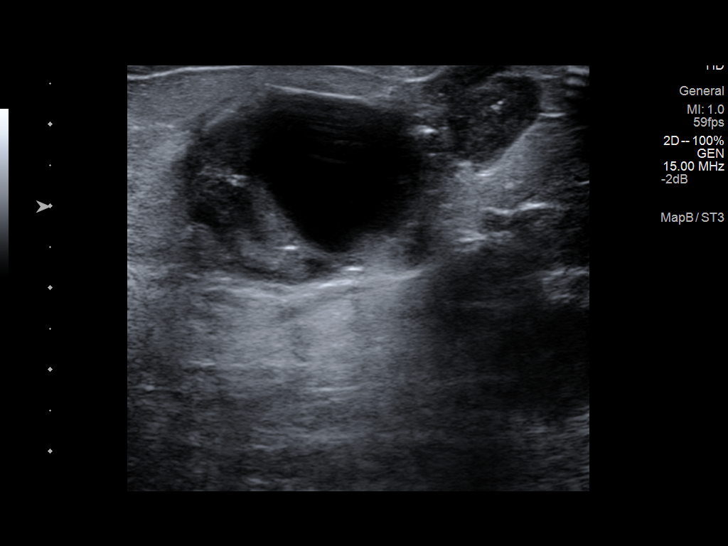
[im 4/19]
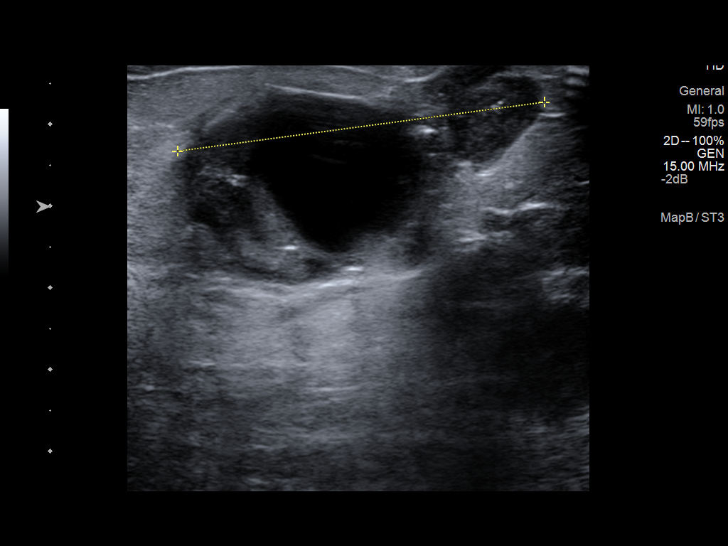
[im 6/19]
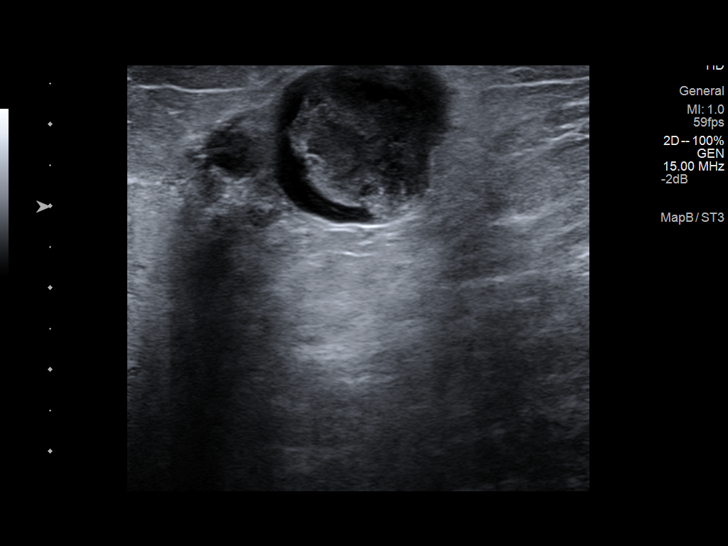
[im 7/19]
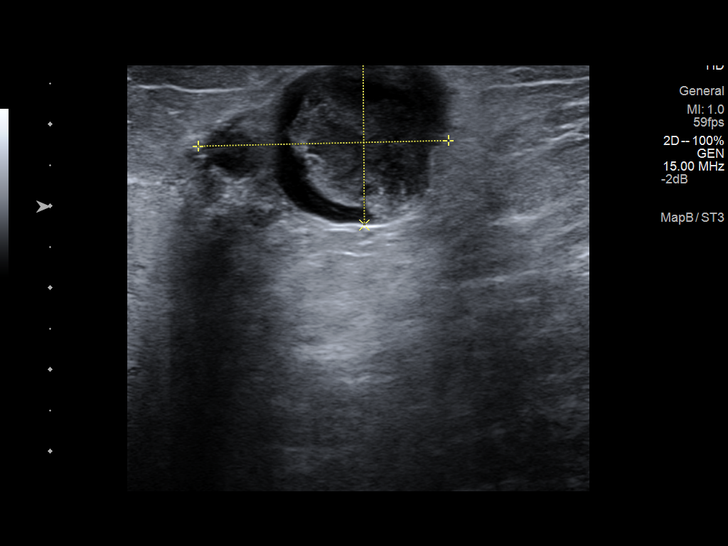
[im 9/19]
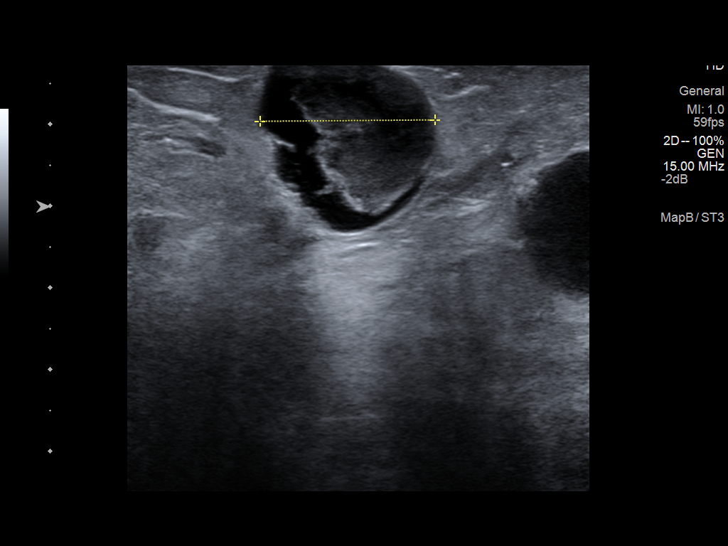
[im 10/19]
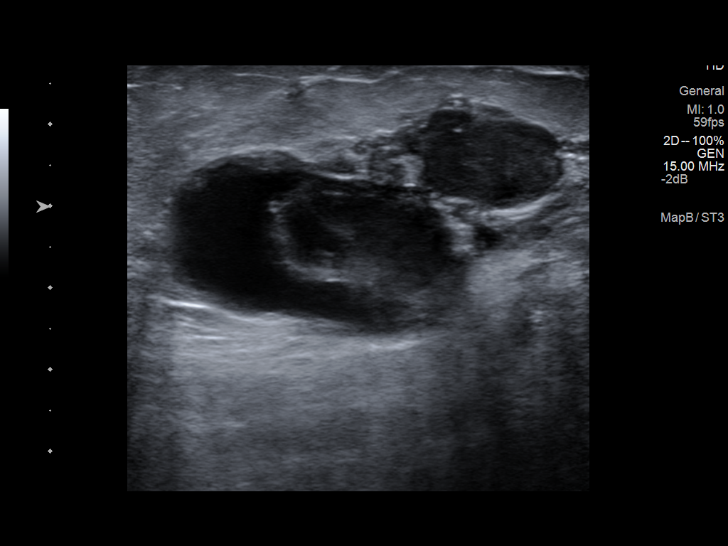
[im 11/19]
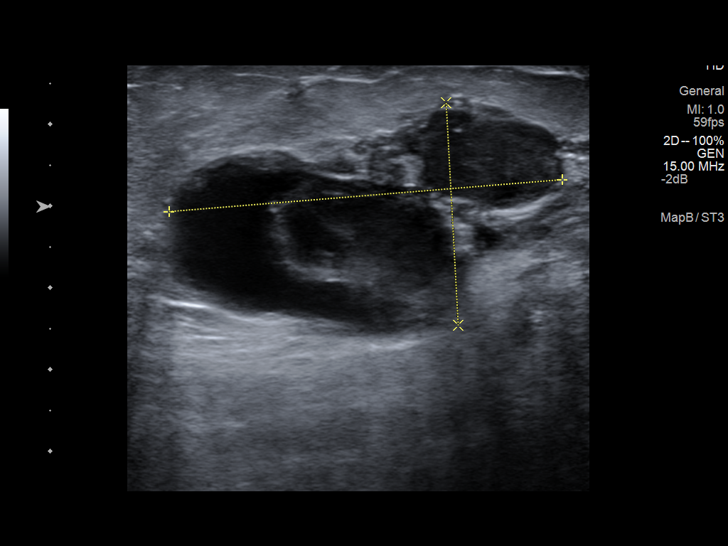
[im 13/19]
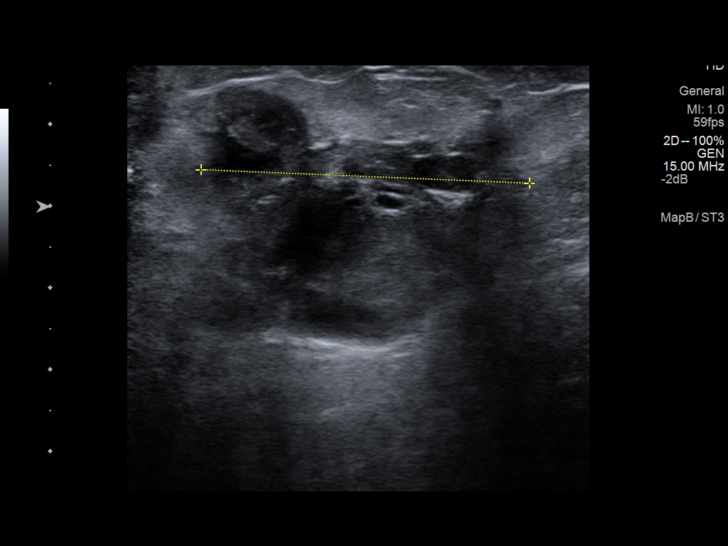
[im 14/19]
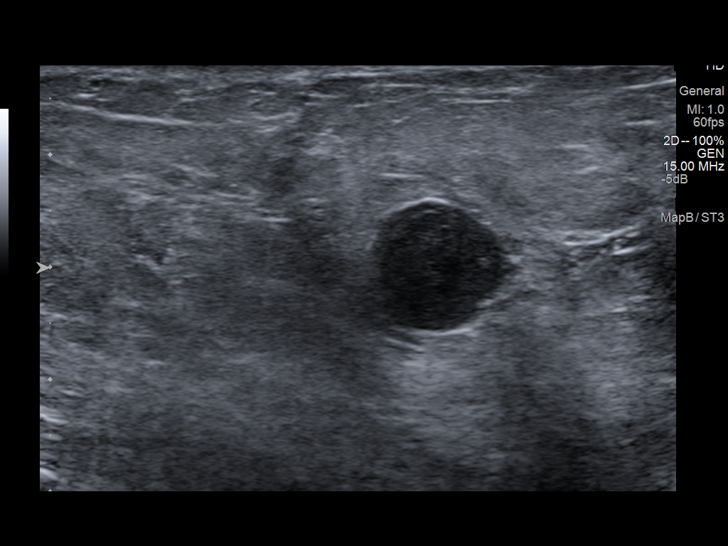
[im 16/19]
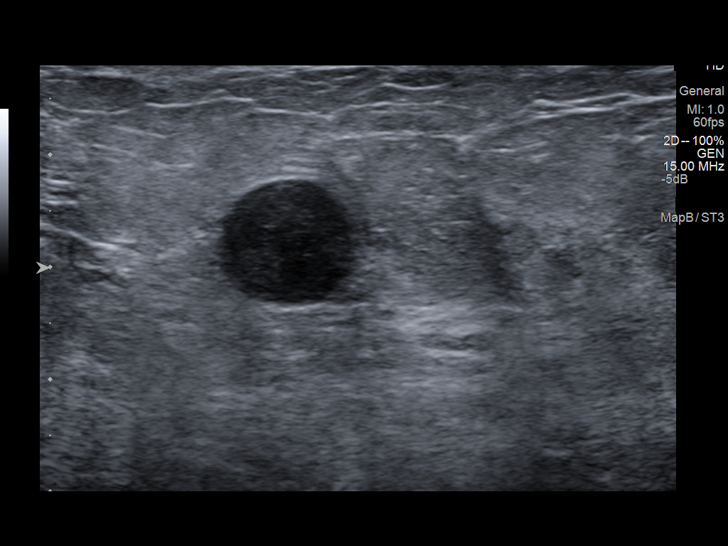
[im 17/19]
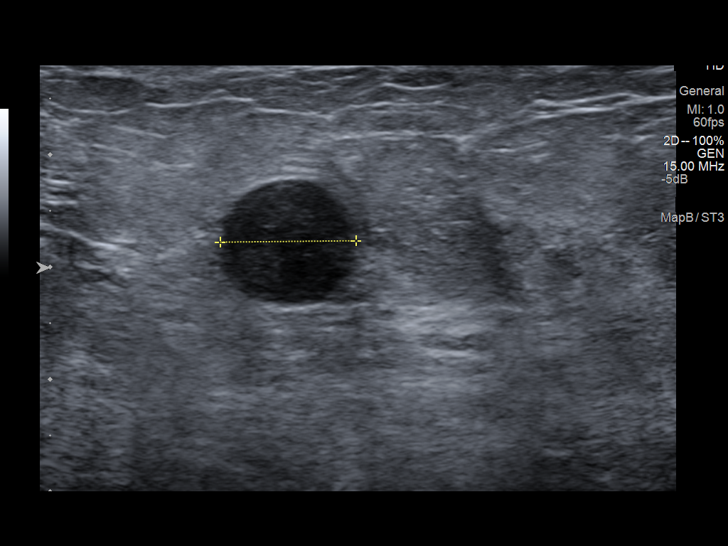
[im 19/19]
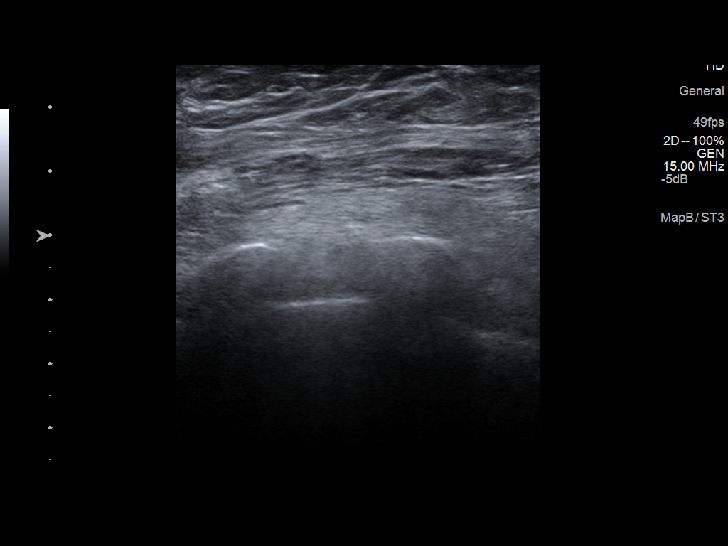

[13 of 19 positions shown; findings below may reference images not displayed]

ACR Breast Density Category b: There are scattered areas of
fibroglandular density.
FINDINGS: Multiple masses are identified within the upper-outer left breast,
with the largest mass measuring up to at least 4.2 cm in the upper
outer left breast. Pleomorphic linear and branching calcifications
are also identified within the upper-outer, and to a lesser extent,
upper inner left breast. In total, the masses and calcifications
span 12.3 x 8.6 x 9.5 cm. The calcifications alone span up to 10 cm.

Mammographic images were processed with CAD.

On physical exam,a large firm fixed mass is palpated centered in the
upper outer left breast. There is retraction of the nipple.

Targeted ultrasound demonstrates numerous in masses in the
upper-outer left breast. For example, at 12 o'clock, 8 cm from the
nipple, there is a 3.6 x 2.7 x 4.5 cm hypoechoic and anechoic
irregular mass. At [DATE], 6 cm from the nipple, there is a 3.1 x
x 2.1 cm mass. At [DATE], 4 cm from the nipple, there is a 4.8 x 2.7 x
4 cm irregular mass. At 10 o'clock, 7 cm from the nipple, there is a
round hypoechoic mass measuring 1.2 x 1.2 x 1.2 cm. Left axilla is
negative for adenopathy.
IMPRESSION: Multiple left breast masses and suspicious microcalcifications
spanning the upper outer and, to a lesser extent, upper inner left
breast.

RECOMMENDATION:
Clinical consultation and consultation with the patient's health
care power-of-attorney is recommended to determine if biopsy is
appropriate for the patient. If clinically appropriate, an
ultrasound-guided biopsy is recommended. This was discussed with
Nurse Claudina Iyer at [DATE] p.m. on 12/15/2013.

I have discussed the findings and recommendations with the patient.
Results were also provided in writing at the conclusion of the
visit.

BI-RADS CATEGORY  5: Highly suggestive of malignancy - appropriate
action should be taken.

## 2015-11-16 IMAGING — PT NM PET TUM IMG INITIAL (PI) SKULL BASE T - THIGH
8 series · 25 of 25 positions shown · non-contrast
Comparison: None.

CLINICAL DATA: Initial treatment strategy for breast carcinoma.
Left breast carcinoma. Prior history of right breast carcinoma with
mastectomy.

EXAM:
NUCLEAR MEDICINE PET SKULL BASE TO THIGH
TECHNIQUE: 11.5 mCi F-18 FDG was injected intravenously. Full-ring PET imaging
was performed from the skull base to thigh after the radiotracer. CT
data was obtained and used for attenuation correction and anatomic
localization.
FASTING BLOOD GLUCOSE:  Value: 139 mg/dl

[Series 3: pet sk_thigh ac · axial · 5.0mm · 4.07mm/px · z∈[-1309,-497]mm · 4 of 204 slices shown]
[im 1/204]
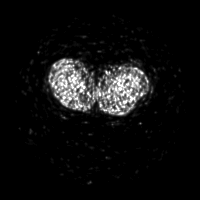
[im 68/204]
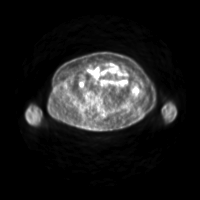
[im 136/204]
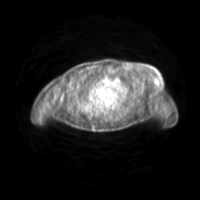
[im 204/204]
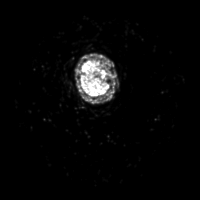

[Series 4: ct sk_thigh 5.0 hd_fov · axial · 5.0mm · 1.13mm/px · z∈[-1309,-497]mm · 5 of 204 slices shown]
[im 1/204]
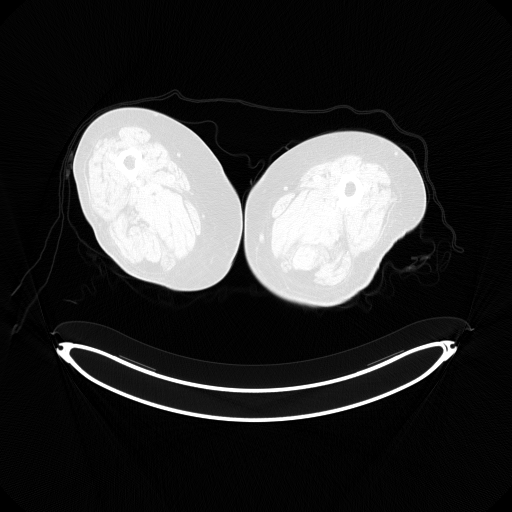
[im 51/204]
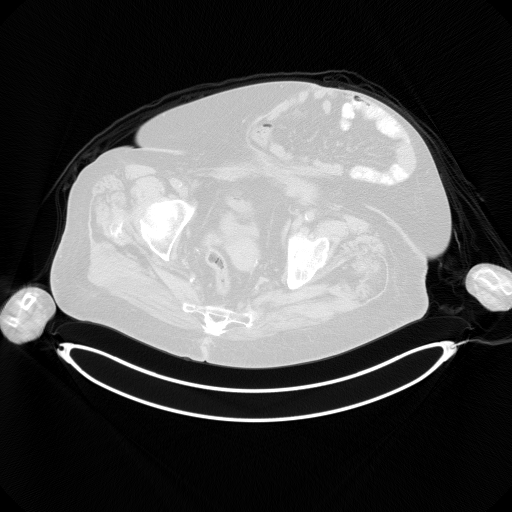
[im 102/204]
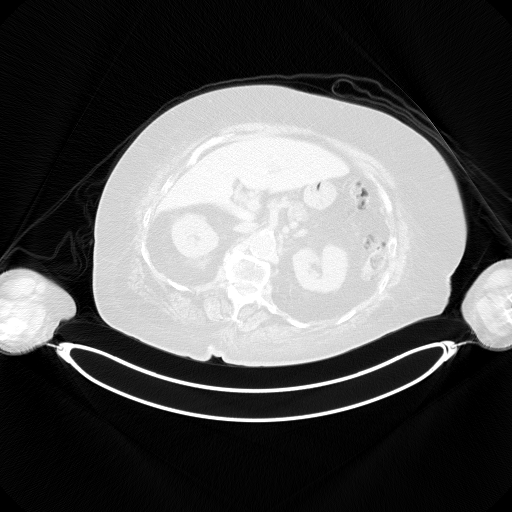
[im 153/204]
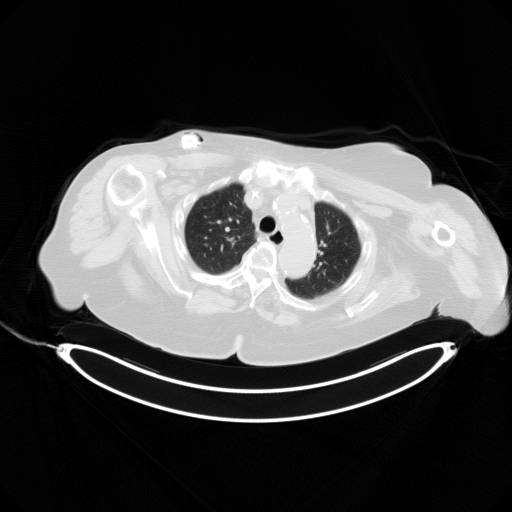
[im 204/204]
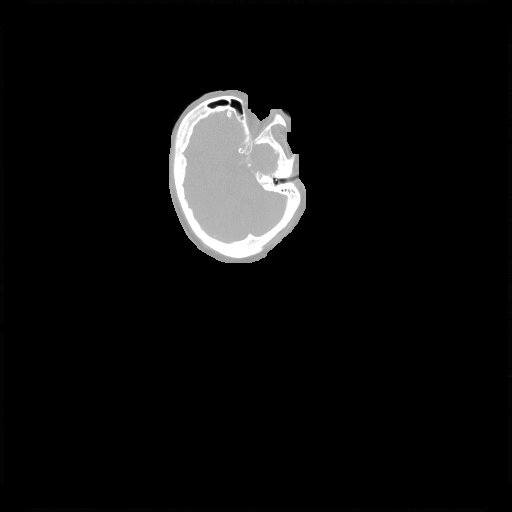

[Series 7: pet sk_thigh nac · axial · 5.0mm · 4.07mm/px · z∈[-1309,-497]mm · 5 of 204 slices shown]
[im 1/204]
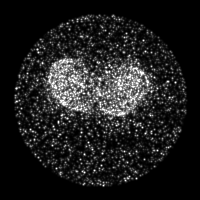
[im 51/204]
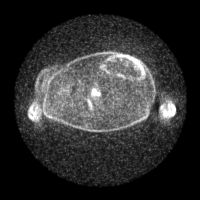
[im 102/204]
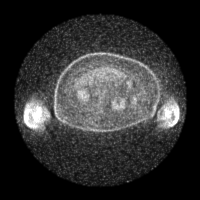
[im 153/204]
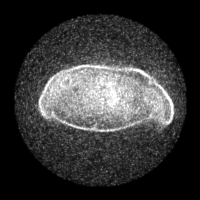
[im 204/204]
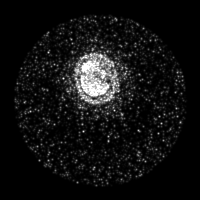

[Series 8: ct sk_thigh 5.0 b70f (id)_bone · axial · 5.0mm · 0.59mm/px · z∈[-877,-613]mm · 2 of 67 slices shown]
[im 1/67  bone]
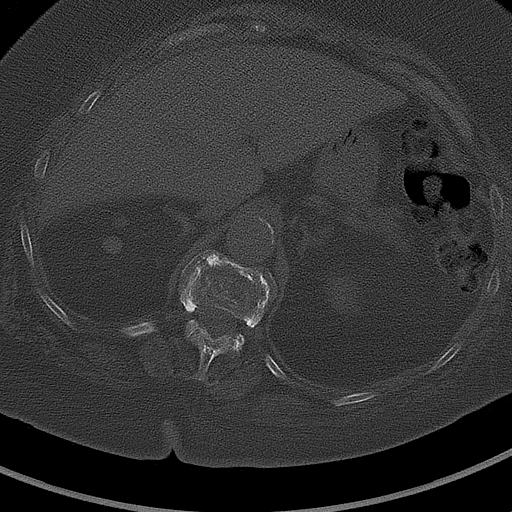
[im 67/67  bone]
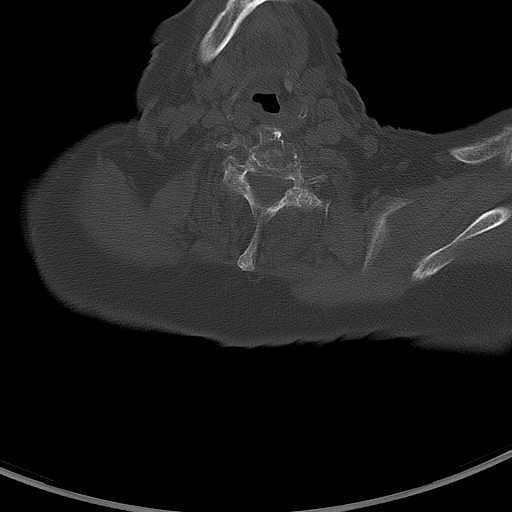

[Series 603: mip collection<mip range> · coronal · 1.69mm/px · 1 of 32 slices shown]
[im 1/32]
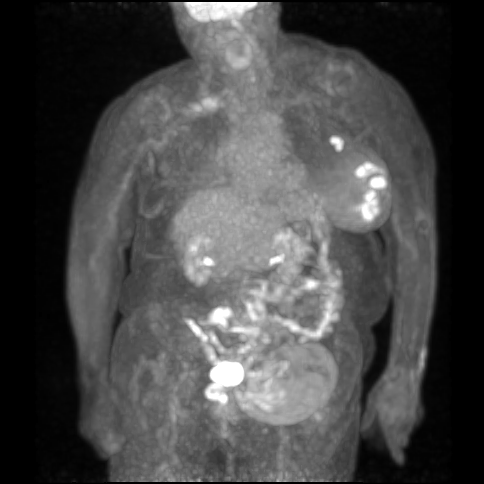

[Series 604: range-ct sk_thigh 5.0 hd_fov-cor-<alpha range> · 2 of 80 slices shown]
[im 1/80]
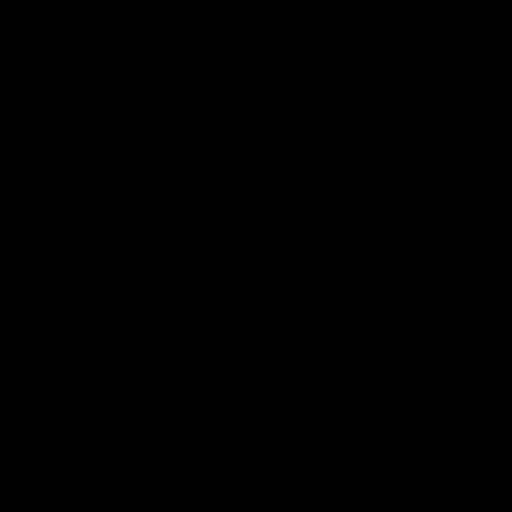
[im 80/80]
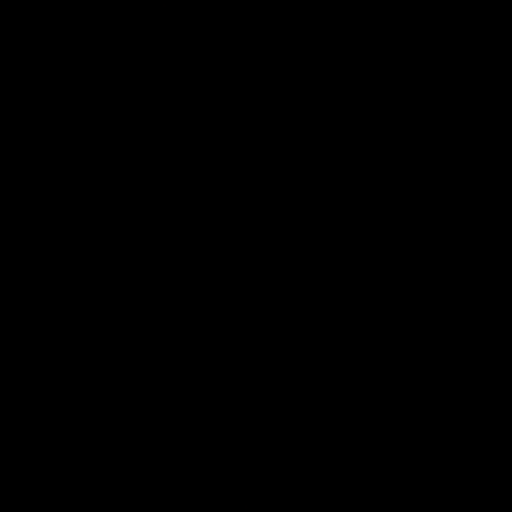

[Series 605: range-ct sk_thigh 5.0 hd_fov-tra-<alpha range> · 5 of 191 slices shown]
[im 1/191]
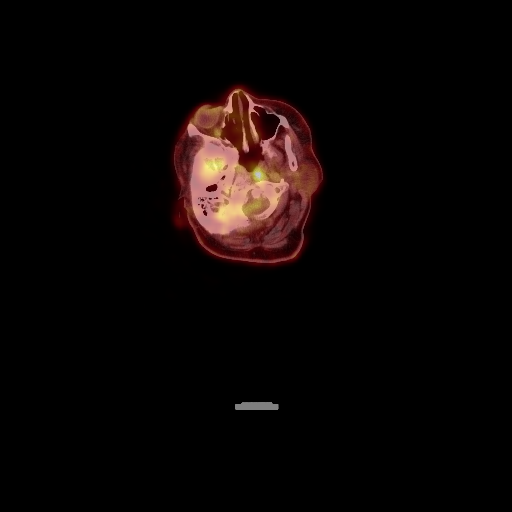
[im 48/191]
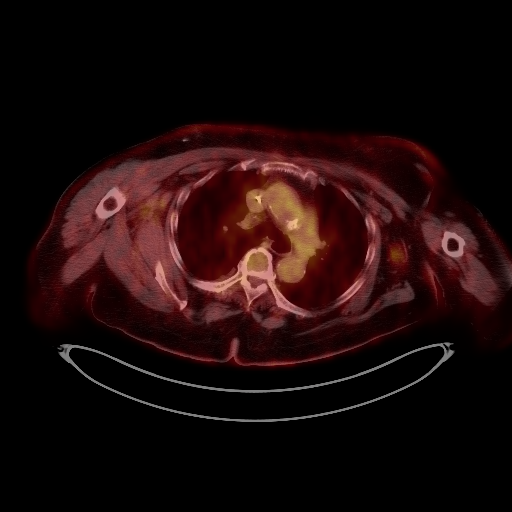
[im 96/191]
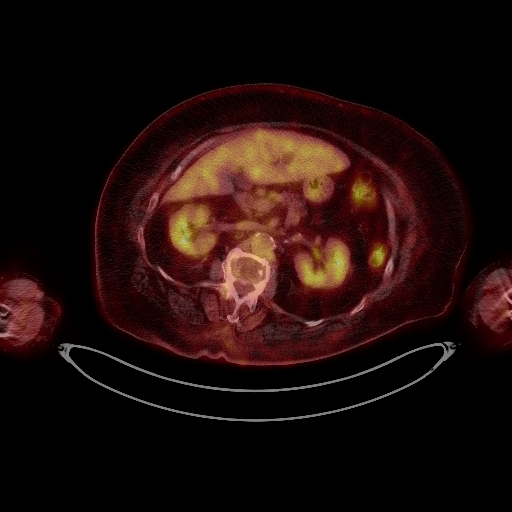
[im 143/191]
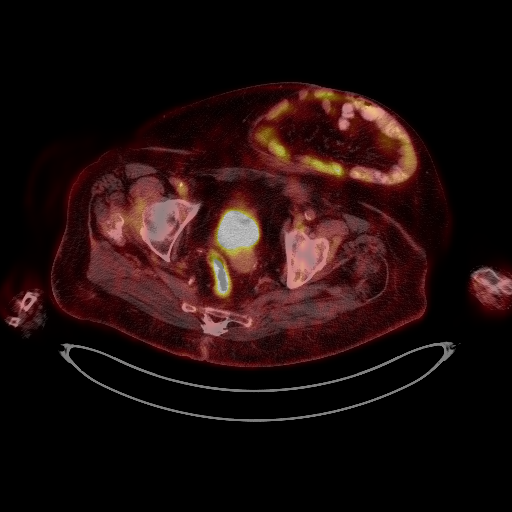
[im 191/191]
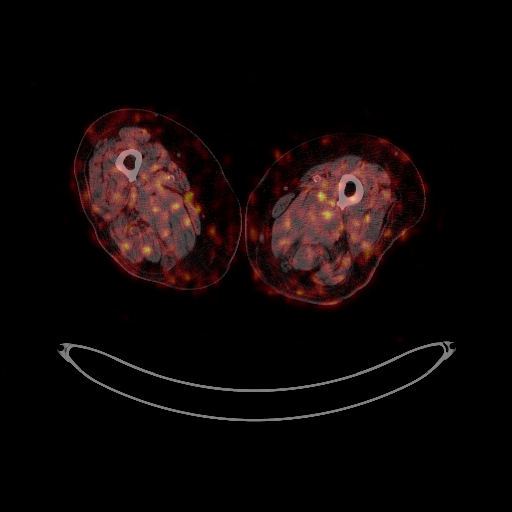

[Series 1032: results mm oncology reading · 1.03mm/px · 1 of 2 slices shown]
[im 1/2]
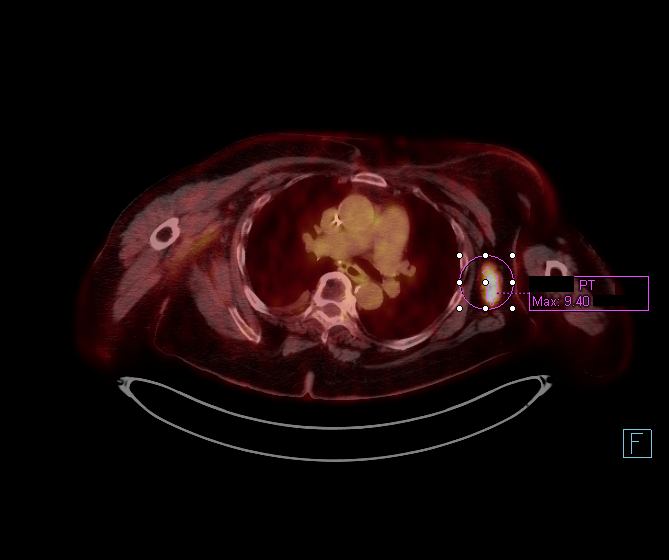

[25 of 25 positions shown; findings below may reference images not displayed]

FINDINGS: NECK

No hypermetabolic lymph nodes in the neck.

CHEST

There is a cluster of hypermetabolic lesions within the lateral left
breast with intense metabolic activity (SUV max 12.3). There are
adjacent enlarged left axillary lymph nodes measuring 18 mm short
axis (image 61) with SUV max 9.4. No hypermetabolic internal mammary
lymph nodes. No hypermetabolic supraclavicular or infraclavicular
nodes. No hypermetabolic mediastinal nodes.

Review of the lung parenchyma demonstrates a small anterior right
pneumothorax extending to the lung bases. Estimated volume of this
pneumothorax is 10% of the right hemi thorax volume. There is small
right effusion.

There is mild metabolic activity along the tract of the port beneath
the clavicle also likely related to inflammation from port
placement.

ABDOMEN/PELVIS

No evidence of liver metastasis. No hypermetabolic abdominal pelvic
lymph nodes. Renal glands are normal.

There is a large ventral hernia which to the long segment of
nonobstructed small bowel. No aggressive osseous lesion. Chronic
calcified granulation lateral to the left hip (image 164_.

SKELETON

No focal hypermetabolic activity to suggest skeletal metastasis.
IMPRESSION: 1. Hypermetabolic primary breast cancer in the left lateral breast.
2. Hypermetabolic axillary nodal metastasis on the left.
3. No evidence of central nodal metastasis or distant metastasis.
4. Small right anterior pneumothorax and pleural fluid related to
port placement on 02/21/2014.
Findings conveyed Ramdhi Totok on 03/01/2014  at[DATE].
